# Patient Record
Sex: Female | Born: 1965
Health system: Southern US, Community
[De-identification: ages and names within clinical notes are randomized; demographics above are authoritative.]

## PROBLEM LIST (undated history)

## (undated) DIAGNOSIS — E785 Hyperlipidemia, unspecified: Secondary | ICD-10-CM

## (undated) DIAGNOSIS — E7800 Pure hypercholesterolemia, unspecified: Secondary | ICD-10-CM

## (undated) DIAGNOSIS — E669 Obesity, unspecified: Secondary | ICD-10-CM

## (undated) DIAGNOSIS — G4730 Sleep apnea, unspecified: Secondary | ICD-10-CM

## (undated) DIAGNOSIS — I10 Essential (primary) hypertension: Secondary | ICD-10-CM

## (undated) HISTORY — PX: STOMACH SURGERY: SHX791

## (undated) HISTORY — DX: Hyperlipidemia, unspecified: E78.5

## (undated) HISTORY — PX: RETAINED PLACENTA REMOVAL: SHX2336

---

## 2011-04-08 ENCOUNTER — Other Ambulatory Visit: Payer: Self-pay | Admitting: General Practice

## 2011-04-08 ENCOUNTER — Ambulatory Visit
Admission: RE | Admit: 2011-04-08 | Discharge: 2011-04-08 | Disposition: A | Discharge status: Home / Self Care | Payer: No Typology Code available for payment source | Source: Ambulatory Visit | Attending: General Practice | Admitting: General Practice

## 2011-04-08 DIAGNOSIS — R7611 Nonspecific reaction to tuberculin skin test without active tuberculosis: Secondary | ICD-10-CM

## 2011-05-25 ENCOUNTER — Emergency Department (HOSPITAL_COMMUNITY)
Admission: EM | Admit: 2011-05-25 | Discharge: 2011-05-26 | Disposition: A | Discharge status: Home / Self Care | Payer: Self-pay | Attending: Emergency Medicine | Admitting: Emergency Medicine

## 2011-05-25 ENCOUNTER — Other Ambulatory Visit: Payer: Self-pay

## 2011-05-25 ENCOUNTER — Emergency Department (HOSPITAL_COMMUNITY): Payer: Self-pay

## 2011-05-25 ENCOUNTER — Inpatient Hospital Stay (HOSPITAL_COMMUNITY): Payer: Self-pay

## 2011-05-25 ENCOUNTER — Encounter (HOSPITAL_COMMUNITY): Payer: Self-pay | Admitting: *Deleted

## 2011-05-25 ENCOUNTER — Inpatient Hospital Stay (HOSPITAL_COMMUNITY)
Admission: AD | Admit: 2011-05-25 | Discharge: 2011-05-25 | Disposition: A | Discharge status: Other Acute Inpatient Hospital | Payer: Self-pay | Source: Ambulatory Visit | Attending: Obstetrics & Gynecology | Admitting: Obstetrics & Gynecology

## 2011-05-25 DIAGNOSIS — Z79899 Other long term (current) drug therapy: Secondary | ICD-10-CM | POA: Insufficient documentation

## 2011-05-25 DIAGNOSIS — R109 Unspecified abdominal pain: Secondary | ICD-10-CM | POA: Insufficient documentation

## 2011-05-25 DIAGNOSIS — R112 Nausea with vomiting, unspecified: Secondary | ICD-10-CM | POA: Insufficient documentation

## 2011-05-25 DIAGNOSIS — R072 Precordial pain: Secondary | ICD-10-CM | POA: Insufficient documentation

## 2011-05-25 DIAGNOSIS — R0602 Shortness of breath: Secondary | ICD-10-CM | POA: Insufficient documentation

## 2011-05-25 DIAGNOSIS — R10819 Abdominal tenderness, unspecified site: Secondary | ICD-10-CM | POA: Insufficient documentation

## 2011-05-25 DIAGNOSIS — I10 Essential (primary) hypertension: Secondary | ICD-10-CM | POA: Insufficient documentation

## 2011-05-25 DIAGNOSIS — R509 Fever, unspecified: Secondary | ICD-10-CM | POA: Insufficient documentation

## 2011-05-25 DIAGNOSIS — R079 Chest pain, unspecified: Secondary | ICD-10-CM | POA: Insufficient documentation

## 2011-05-25 HISTORY — DX: Essential (primary) hypertension: I10

## 2011-05-25 LAB — CK TOTAL AND CKMB (NOT AT ARMC)
CK, MB: 1.8 ng/mL (ref 0.3–4.0)
Relative Index: 1.2 (ref 0.0–2.5)
Relative Index: 1.1 (ref 0.0–2.5)

## 2011-05-25 LAB — BLOOD GAS, ARTERIAL
Drawn by: 132
FIO2: 0.21 %
pCO2 arterial: 41.7 mmHg (ref 35.0–45.0)
pH, Arterial: 7.424 — ABNORMAL HIGH (ref 7.350–7.400)

## 2011-05-25 LAB — COMPREHENSIVE METABOLIC PANEL
ALT: 17 U/L (ref 0–35)
AST: 19 U/L (ref 0–37)
Albumin: 3.5 g/dL (ref 3.5–5.2)
Alkaline Phosphatase: 47 U/L (ref 39–117)
Glucose, Bld: 87 mg/dL (ref 70–99)
Potassium: 3.5 mEq/L (ref 3.5–5.1)
Sodium: 140 mEq/L (ref 135–145)
Total Protein: 7.6 g/dL (ref 6.0–8.3)

## 2011-05-25 LAB — TROPONIN I
Troponin I: <0.3 ng/mL (ref <0.30)
Troponin I: <0.3 ng/mL (ref <0.30)

## 2011-05-25 LAB — DIFFERENTIAL
Eosinophils Absolute: 0.1 10*3/uL (ref 0.0–0.7)
Eosinophils Relative: 2 % (ref 0–5)
Lymphs Abs: 2.4 10*3/uL (ref 0.7–4.0)
Monocytes Relative: 10 % (ref 3–12)
Neutrophils Relative %: 48 % (ref 43–77)

## 2011-05-25 LAB — CBC
MCH: 28.4 pg (ref 26.0–34.0)
MCV: 82.6 fL (ref 78.0–100.0)
Platelets: 222 10*3/uL (ref 150–400)
RBC: 4.26 MIL/uL (ref 3.87–5.11)

## 2011-05-25 LAB — URINALYSIS, ROUTINE W REFLEX MICROSCOPIC
Bilirubin Urine: NEGATIVE
Hgb urine dipstick: NEGATIVE
Specific Gravity, Urine: 1.012 (ref 1.005–1.030)
Urobilinogen, UA: 0.2 mg/dL (ref 0.0–1.0)

## 2011-05-25 MED ORDER — IOHEXOL 300 MG/ML  SOLN
100.0000 mL | Freq: Once | INTRAMUSCULAR | Status: AC | PRN
  Administered 2011-05-25: 100 mL via INTRAVENOUS

## 2011-05-25 MED ORDER — SODIUM CHLORIDE 0.9 % IV SOLN
Freq: Once | INTRAVENOUS | Status: AC
  Administered 2011-05-25: 14:00:00 via INTRAVENOUS

## 2011-05-25 NOTE — ED Notes (Signed)
Accompanied pt down to radiology dept.

## 2011-05-25 NOTE — ED Provider Notes (Addendum)
History   Chest pain, onset last pm. The patient states that she began feeling bad last night with chills and ? Fever. Today feeling short of breath and pain on left side of chest. Vomiting in exam room. 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 No chief complaint on file.  The history is provided by the patient. The history is limited by a language barrier.  The patient does speak [place] and understands. Her native language is [place]. I discussed with the patient that if she has any difficulty understanding the medical information that we can get a translator.  No past medical history on file.  No past surgical history on file.  No family history on file.  History  Substance Use Topics  . Smoking status: Not on file  . Smokeless tobacco: Not on file  . Alcohol Use: Not on file    OB History    No data available      Review of Systems  Constitutional: Positive for fever and chills. Negative for diaphoresis and fatigue.  HENT: Negative for ear pain, congestion, sore throat, facial swelling, neck pain, neck stiffness, dental problem and sinus pressure.   Eyes: Negative for photophobia, pain and discharge.  Respiratory: Positive for chest tightness and shortness of breath.   Cardiovascular: Positive for chest pain.  Gastrointestinal: Positive for nausea, vomiting and abdominal pain. Negative for diarrhea and constipation.  Genitourinary: Negative for dysuria, frequency, flank pain and difficulty urinating.  Musculoskeletal: Positive for back pain. Negative for myalgias and gait problem.  Skin: Negative for color change and rash.  Neurological: Positive for dizziness and light-headedness. Negative for facial asymmetry, speech difficulty, weakness, numbness and headaches.  Psychiatric/Behavioral: Negative for confusion and agitation.    Physical Exam  BP 134/65  Pulse 89  Temp(Src) 97.7 F (36.5 C) (Oral)  Resp 24  SpO2 98%  Physical Exam  Constitutional: No distress.       Obese female  Neck: Trachea normal. Neck supple. No JVD present.  Cardiovascular: Normal rate and regular rhythm.   Pulmonary/Chest: She exhibits tenderness.         Chest wall tenderness on palp left.  Abdominal: Soft. There is tenderness in the right lower quadrant. There is no rebound, no guarding and no CVA tenderness.    Psychiatric: Her behavior is normal.    ED Course  Procedures  MDM Patient with chest pain and shortness of breath.On arrival to MAU O2 sat was 82, pt. Placed on 2L O2 and in upright sitting position. Sat up to 95.  ABG drawn and Co2 =58, otherwise normal. EKG done, chest x-ray and will transfer to [facility] for further evaluation of chest pain and SOB. Spoke with Dr. Nino Parsley and he will accept. Patient.      [place], [place] 06/08/11 2006

## 2011-05-25 NOTE — Initial Assessments (Signed)
Pt transferred to [facility]  Via care link  Accompanied by pt aunt and care link team.

## 2011-05-25 NOTE — ED Notes (Signed)
Pt back from radiology. Tolerated well

## 2012-08-02 ENCOUNTER — Emergency Department (HOSPITAL_COMMUNITY): Payer: Self-pay

## 2012-08-02 ENCOUNTER — Emergency Department (HOSPITAL_COMMUNITY)
Admission: EM | Admit: 2012-08-02 | Discharge: 2012-08-02 | Disposition: A | Discharge status: Home / Self Care | Payer: Self-pay | Attending: Emergency Medicine | Admitting: Emergency Medicine

## 2012-08-02 ENCOUNTER — Encounter (HOSPITAL_COMMUNITY): Payer: Self-pay | Admitting: Emergency Medicine

## 2012-08-02 DIAGNOSIS — R0602 Shortness of breath: Secondary | ICD-10-CM | POA: Insufficient documentation

## 2012-08-02 DIAGNOSIS — R0609 Other forms of dyspnea: Secondary | ICD-10-CM | POA: Insufficient documentation

## 2012-08-02 DIAGNOSIS — R10819 Abdominal tenderness, unspecified site: Secondary | ICD-10-CM | POA: Insufficient documentation

## 2012-08-02 DIAGNOSIS — R0600 Dyspnea, unspecified: Secondary | ICD-10-CM

## 2012-08-02 DIAGNOSIS — I10 Essential (primary) hypertension: Secondary | ICD-10-CM | POA: Insufficient documentation

## 2012-08-02 DIAGNOSIS — R0989 Other specified symptoms and signs involving the circulatory and respiratory systems: Secondary | ICD-10-CM | POA: Insufficient documentation

## 2012-08-02 DIAGNOSIS — R42 Dizziness and giddiness: Secondary | ICD-10-CM

## 2012-08-02 DIAGNOSIS — R109 Unspecified abdominal pain: Secondary | ICD-10-CM

## 2012-08-02 DIAGNOSIS — K5730 Diverticulosis of large intestine without perforation or abscess without bleeding: Secondary | ICD-10-CM | POA: Insufficient documentation

## 2012-08-02 DIAGNOSIS — R51 Headache: Secondary | ICD-10-CM | POA: Insufficient documentation

## 2012-08-02 HISTORY — DX: Obesity, unspecified: E66.9

## 2012-08-02 LAB — COMPREHENSIVE METABOLIC PANEL
ALT: 13 U/L (ref 0–35)
AST: 18 U/L (ref 0–37)
Alkaline Phosphatase: 48 U/L (ref 39–117)
CO2: 25 mEq/L (ref 19–32)
GFR calc Af Amer: >90 mL/min (ref >90)
GFR calc non Af Amer: >90 mL/min (ref >90)
Glucose, Bld: 121 mg/dL — ABNORMAL HIGH (ref 70–99)
Potassium: 3.8 mEq/L (ref 3.5–5.1)
Sodium: 140 mEq/L (ref 135–145)
Total Protein: 7.6 g/dL (ref 6.0–8.3)

## 2012-08-02 LAB — CBC
Hemoglobin: 12.6 g/dL (ref 12.0–15.0)
Platelets: 239 10*3/uL (ref 150–400)
RBC: 4.61 MIL/uL (ref 3.87–5.11)
WBC: 5.7 10*3/uL (ref 4.0–10.5)

## 2012-08-02 LAB — POCT I-STAT, CHEM 8
HCT: 41 % (ref 36.0–46.0)
Hemoglobin: 13.9 g/dL (ref 12.0–15.0)
Potassium: 3.9 mEq/L (ref 3.5–5.1)
Sodium: 142 mEq/L (ref 135–145)

## 2012-08-02 LAB — URINALYSIS, ROUTINE W REFLEX MICROSCOPIC
Bilirubin Urine: NEGATIVE
Hgb urine dipstick: NEGATIVE
Ketones, ur: NEGATIVE mg/dL
Nitrite: NEGATIVE
pH: 6 (ref 5.0–8.0)

## 2012-08-02 LAB — POCT I-STAT TROPONIN I: Troponin i, poc: 0.01 ng/mL (ref 0.00–0.08)

## 2012-08-02 MED ORDER — IOHEXOL 300 MG/ML  SOLN: 20.0000 mL | INTRAMUSCULAR | Status: AC

## 2012-08-02 MED ORDER — IOHEXOL 300 MG/ML  SOLN
100.0000 mL | Freq: Once | INTRAMUSCULAR | Status: AC | PRN
  Administered 2012-08-02: 100 mL via INTRAVENOUS

## 2012-08-02 MED ORDER — HYDROMORPHONE HCL PF 1 MG/ML IJ SOLN
1.0000 mg | Freq: Once | INTRAMUSCULAR | Status: AC
  Administered 2012-08-02: 1 mg via INTRAVENOUS
  Filled 2012-08-02: qty 1

## 2012-08-02 NOTE — ED Provider Notes (Signed)
History     CSN: [id number]  Arrival date & time 08/02/12  0418   First MD Initiated Contact with Patient 08/02/12 0425      Chief Complaint  Patient presents with  . Shortness of Breath    (Consider location/radiation/quality/duration/timing/severity/associated sxs/prior treatment) HPI Comments: This is a 46 year old female past medical history hypertension hyperlipidemia and obesity he presents to the emergency department this morning with shortness of breath and dizziness. She states that she has had one episode of shortness of breath before this. It occurred earlier this week when she was coming home from church.   Additionally, she complains that her entire body hurts. She denies nausea, vomiting, constipation, diarrhea, dysuria.    The history is provided by the patient. No language interpreter was used.    Past Medical History  Diagnosis Date  . Hypertension   . Obesity     Past Surgical History  Procedure Date  . Retained placenta removal     after child birth    No family history on file.  History  Substance Use Topics  . Smoking status: Never Smoker   . Smokeless tobacco: Not on file  . Alcohol Use: Yes     once a month    OB History    Grav Para Term Preterm Abortions TAB SAB Ect Mult Living   5 2 2  0 3 3 0 0 0 2      Review of Systems  Constitutional: Negative for fever.  Gastrointestinal: Positive for abdominal pain.  Neurological: Positive for dizziness and headaches.  All other systems reviewed and are negative.    Allergies  Review of patient's allergies indicates no known allergies.  Home Medications   Current Outpatient Rx  Name Route Sig Dispense Refill  . ACETAMINOPHEN 500 MG PO TABS Oral Take 1,000 mg by mouth every 6 (six) hours as needed. For pain/fever     . DIAZEPAM 10 MG PO TABS Oral Take 10 mg by mouth 2 (two) times daily. For anxiety     . LISINOPRIL-HYDROCHLOROTHIAZIDE 20-12.5 MG PO TABS Oral Take 1 tablet by mouth daily.         BP 175/105  Pulse 76  Temp 97.2 F (36.2 C) (Oral)  Resp 18  SpO2 97%  Physical Exam  Nursing note and vitals reviewed. Constitutional: She is oriented to person, place, and time. She appears well-developed and well-nourished.  HENT:  Head: Normocephalic.  Eyes: Conjunctivae normal and EOM are normal. Pupils are equal, round, and reactive to light.  Neck: Normal range of motion. Neck supple.  Cardiovascular: Normal rate, regular rhythm and normal heart sounds.   Pulmonary/Chest: Effort normal and breath sounds normal.  Abdominal: Soft. Bowel sounds are normal. There is tenderness. There is rebound and guarding.  Musculoskeletal: Normal range of motion.  Neurological: She is alert and oriented to person, place, and time.  Skin: Skin is warm and dry.  Psychiatric: She has a normal mood and affect. Her behavior is normal. Judgment and thought content normal.    ED Course  Procedures (including critical care time)   Labs Reviewed  CBC  COMPREHENSIVE METABOLIC PANEL  URINALYSIS, ROUTINE W REFLEX MICROSCOPIC   ED ECG REPORT  I personally interpreted this EKG   Date: 08/02/2012   Rate:69   Rhythm: normal sinus rhythm  QRS Axis: normal  Intervals: normal  ST/T Wave abnormalities: normal  Conduction Disutrbances:none  Narrative Interpretation:   Old EKG Reviewed: unchanged  No results found.   No  diagnosis found.    MDM  46 year old female with multiple problems.  1-Vertigo: symptoms are reproducible with head movement.  Believe this to be a benign process.  2-Abdominal Pain: Awaiting CTA to rule out appy.    3-SOB: Seems to be 2/2 to the abdominal pain.  Vitals are stable.  This patient has been discussed at length with Dr. Hyacinth Meeker, who has helped form the treatment plan.  CTA pending.        Roxy Horseman, PA-C 08/02/12 0710

## 2012-08-02 NOTE — ED Provider Notes (Signed)
Medical screening examination/treatment/procedure(s) were conducted as a shared visit with non-physician practitioner(s) and myself.  I personally evaluated the patient during the encounter  Please see my separate respective documentation pertaining to this patient encounter   Vida Roller, MD 08/02/12 [phone]

## 2012-08-02 NOTE — ED Provider Notes (Signed)
Gail Jordan is a 46 y.o. female received as a checkout from Dr. Hyacinth Meeker to evaluate after CAT scan of abdomen to rule out surgical pathology. Initial workup was nondiagnostic. CT abdomen is negative for acute process. Brief review of her earlier workup: there is no apparent central neurologic, or cardiac source for her symptoms of dizziness and shortness of breath.  Medical decision making: Patient stable for discharge with outpatient management. She currently does not have a PCP. She can followup with her cardiologist, regarding the shortness of breath, and any acute medical problems that might be necessary.  Diagnoses #1 nonspecific dizziness. #2 shortness of breath. #3 nonspecific abdominal pain  Plan: Home Medications- usual plus Tylenol for pain; Home Treatments- regular diet; Recommended follow up- Cardiology 1 week; find a PCP  Flint Melter, MD 08/02/12 1029

## 2012-08-02 NOTE — ED Notes (Signed)
Pt reports sob since Sunday. Also Hurting all over her body. Pt reports dizziness. Denies nvd.

## 2012-08-02 NOTE — ED Notes (Signed)
I stat Troponin Results  cTnl  0.01 ng/mL

## 2012-08-02 NOTE — ED Provider Notes (Signed)
46 year old female who presents with several complaints  #1 shortness of breath, patient states that she awoke with shortness of breath after a nap when she got home from church yesterday, shortness of breath has been relatively persistent, not associated with coughing fevers chills nausea or vomiting.  #2 abdominal pain and "hurting all over". The patient states that she has pain in her abdomen but when palpated has pain mostly in the right lower quadrant with guarding. There is no obvious mass or change in the skin in this area. There is no other signs of abdominal tenderness and no peritoneal signs.  #3 dizziness: The patient states that she has a feeling of the room spinning. This seems to get worse when she changes position for example laying down or turning her head from side to side it improves as she holds it in that position. There is fatigability to her dizziness.  On physical exam the patient is obese, has tenderness to the right lower quadrant but clear heart and lung sounds with regular rate and rhythm strong peripheral artery pulses at the radial arteries. Her oropharynx is clear, mucous members are moist and vital signs show slight hypertension at 175/105.  Filed Vitals:   08/02/12 0427  BP: 175/105  Pulse: 76  Temp: 97.2 F (36.2 C)  Resp: 18    Lab work reveals normal liver function, normal renal function, normal electrolytes and normal blood counts. Her chest x-ray is fairly nonspecific showing some basilar atelectasis but no other acute findings. At this time due to the patient's right lower quadrant pain will perform a CT scan to rule out appendicitis. The patient states that she has had a placental surgery in the past and has a midline vertical scar in her lower abdomen.  The dizziness appears to be peripheral vertigo in origin, tympanic membranes are clear bilaterally and with the fatigability of the vertigo I suspect this is a benign source.  Hydromorphone given for pain,  reevaluate after CT scan.  She has no hypoxia, no respiratory distress and fairly normal lung sounds. Her EKG is nonischemic showing no signs of myocardial infarction. In fact a pulse of 69, normal intervals and normal ST segments. She has no risk factors for pulmonary embolism either.  Medical screening examination/treatment/procedure(s) were conducted as a shared visit with non-physician practitioner(s) and myself.  I personally evaluated the patient during the encounter      Vida Roller, MD 08/02/12 [phone]

## 2012-08-02 NOTE — ED Notes (Signed)
PT. REPORTS PROGRESSING SOB ONSET YESTERDAY , DENIES COUGH OR CONGESTION , LOW GRADE FEVER .

## 2012-08-02 NOTE — ED Notes (Signed)
Chem 8 results  Na  142 K  3.9 Cl  107 iCa  1.24 TC02  23 Glu  117 BUN  15 Crea  0.8 Hct  41% Hb  13.9 AnGap  17

## 2012-10-27 ENCOUNTER — Ambulatory Visit (INDEPENDENT_AMBULATORY_CARE_PROVIDER_SITE_OTHER): Payer: Self-pay | Admitting: Pulmonary Disease

## 2012-10-27 ENCOUNTER — Encounter: Payer: Self-pay | Admitting: Pulmonary Disease

## 2012-10-27 VITALS — BP 122/74 | HR 85 | Temp 97.6°F | Ht 66.0 in | Wt 286.2 lb

## 2012-10-27 DIAGNOSIS — G4733 Obstructive sleep apnea (adult) (pediatric): Secondary | ICD-10-CM

## 2012-10-27 NOTE — Progress Notes (Signed)
  Subjective:    Patient ID: Gail Jordan, female    DOB: 1966/07/13, 46 y.o.   MRN: [id number]  HPI The patient is a 46 year old female who comes in today as a self-referral for possible obstructive sleep apnea.  She has been noted to have loud snoring, as well as an abnormal breathing pattern during sleep.  She also admits to having episodes of shortness of breath at night, including a choking arousal.  She has frequent awakenings at night, and is not rested in the mornings upon arising.  She notes definite sleepiness during the day with inactivity, and will fall asleep easily with television or movies in the evening.  She denies issues with sleepiness while driving short distances, but does have sleep pressure with longer distances.  The patient has gained weight over the last year or 2, and her Epworth score today is 14.  Sleep Questionnaire: What time do you typically go to bed?( Between what hours) 11pm How long does it take you to fall asleep? 1 hour How many times during the night do you wake up? 6 What time do you get out of bed to start your day? 0700 Do you drive or operate heavy machinery in your occupation? No How much has your weight changed (up or down) over the past two years? (In pounds) 20 lb (9.072 kg) Have you ever had a sleep study before? No Do you currently use CPAP? No Do you wear oxygen at any time? No    Review of Systems  Constitutional: Negative for fever and unexpected weight change.  HENT: Positive for congestion, rhinorrhea, sneezing and sinus pressure. Negative for ear pain, nosebleeds, sore throat, trouble swallowing, dental problem and postnasal drip.   Eyes: Negative for redness and itching.  Respiratory: Positive for cough, chest tightness, shortness of breath and wheezing.   Cardiovascular: Positive for palpitations and leg swelling ( feet).  Gastrointestinal: Negative for nausea and vomiting.  Genitourinary: Negative for dysuria.  Musculoskeletal: Negative for  joint swelling.  Skin: Negative for rash.  Neurological: Positive for headaches.  Hematological: Does not bruise/bleed easily.  Psychiatric/Behavioral: Positive for dysphoric mood. The patient is not nervous/anxious.        Objective:   Physical Exam Constitutional:  Obese female, no acute distress.  Speaks some English to communicate.  HENT:  Nares patent without discharge, but large turbinates.  Oropharynx without exudate, palate and uvula are very thick and long, crowding posteriorly  Eyes:  Perrla, eomi, no scleral icterus  Neck:  No JVD, no TMG  Cardiovascular:  Normal rate, regular rhythm, no rubs or gallops.  No murmurs        Intact distal pulses  Pulmonary :  Normal breath sounds, no stridor or respiratory distress   No rales, rhonchi, or wheezing  Abdominal:  Soft, nondistended, bowel sounds present.  No tenderness noted.   Musculoskeletal:  mild lower extremity edema noted.  Lymph Nodes:  No cervical lymphadenopathy noted  Skin:  No cyanosis noted  Neurologic:  Appears sleepy, appropriate, moves all 4 extremities without obvious deficit.         Assessment & Plan:

## 2012-10-27 NOTE — Assessment & Plan Note (Signed)
The patient's history is very suggestive of clinically significant sleep apnea.  She is morbidly obese, has an abnormal upper airway anatomy, and has classic symptoms at night and during the day.  I have had a long discussion with her about the pathophysiology of sleep apnea, including its impact to her quality of life and cardiovascular health.  I think she needs to have a sleep study scheduled, and the patient is agreeable.

## 2012-10-27 NOTE — Patient Instructions (Addendum)
Will schedule for sleep study, and will arrange a followup visit with me when the results are available.  Work on weight loss.

## 2012-11-14 ENCOUNTER — Encounter (HOSPITAL_BASED_OUTPATIENT_CLINIC_OR_DEPARTMENT_OTHER): Payer: Self-pay

## 2012-11-15 ENCOUNTER — Ambulatory Visit (HOSPITAL_BASED_OUTPATIENT_CLINIC_OR_DEPARTMENT_OTHER): Payer: Self-pay | Attending: Pulmonary Disease | Admitting: Radiology

## 2012-11-15 VITALS — Ht 66.0 in | Wt 250.0 lb

## 2012-11-15 DIAGNOSIS — G4733 Obstructive sleep apnea (adult) (pediatric): Secondary | ICD-10-CM | POA: Insufficient documentation

## 2012-11-24 ENCOUNTER — Telehealth: Payer: Self-pay | Admitting: Pulmonary Disease

## 2012-11-24 NOTE — Telephone Encounter (Signed)
KC, have you read his PSG yet? Or do you want me to go ahead and set up an ov top discuss? Please advise thanks!

## 2012-11-28 ENCOUNTER — Encounter (HOSPITAL_BASED_OUTPATIENT_CLINIC_OR_DEPARTMENT_OTHER): Payer: Self-pay

## 2012-11-29 DIAGNOSIS — G4710 Hypersomnia, unspecified: Secondary | ICD-10-CM

## 2012-11-29 DIAGNOSIS — G4730 Sleep apnea, unspecified: Secondary | ICD-10-CM

## 2012-11-29 NOTE — Telephone Encounter (Signed)
Sleep study reviewed.Marland KitchenMarland KitchenPatient scheduled for OV 12/06/12 at 330 with KC (per [facility])

## 2012-11-29 NOTE — Procedures (Signed)
NAME:  Gail Jordan, Gail Jordan                ACCOUNT NO.:  [id number]  MEDICAL RECORD NO.:  [id number]          PATIENT TYPE:  OUT  LOCATION:  SLEEP CENTER                 FACILITY:  [facility]  PHYSICIAN:  Barbaraann Share, MD,FCCPDATE OF BIRTH:  05/11/1965  DATE OF STUDY:  11/15/2012                           NOCTURNAL POLYSOMNOGRAM  REFERRING PHYSICIAN:  Barbaraann Share, MD,FCCP  INDICATION FOR STUDY:  Hypersomnia with sleep apnea.  EPWORTH SLEEPINESS SCORE:  18.  SLEEP ARCHITECTURE:  The patient had a total sleep time of 286 minutes with no slow-wave sleep and only 41 minutes of REM.  Sleep onset latency was normal at 16 minutes and REM onset was normal at 104 minutes.  Sleep efficiency was moderately reduced at 81%.  RESPIRATORY DATA:  The patient was found to have 51 apneas and 241 obstructive hypopneas, giving her an apnea-hypopnea index of 61 events per hour.  The events occurred in all body positions, and there was loud snoring noted throughout.  OXYGEN DATA:  O2 desaturation as low as 69% was noted with the patient's obstructive events.  CARDIAC DATA:  No clinically significant arrhythmias were seen.  MOVEMENTS/PARASOMNIA:  The patient had no significant leg jerks or other abnormal behaviors noted.  IMPRESSION/RECOMMENDATION:  Severe obstructive sleep apnea/hypopnea syndrome with an AHI of 61 events per hour and oxygen desaturation as low as 69%.  Treatment for this degree of sleep apnea should focus primarily on CPAP as well as weight loss.     Barbaraann Share, MD,FCCP Diplomate, American Board of Sleep Medicine    [facility]/MEDQ  D:  11/29/2012 08:14:28  T:  11/29/2012 08:47:52  Job:  [id number]

## 2012-12-06 ENCOUNTER — Ambulatory Visit (INDEPENDENT_AMBULATORY_CARE_PROVIDER_SITE_OTHER): Payer: Self-pay | Admitting: Pulmonary Disease

## 2012-12-06 ENCOUNTER — Encounter: Payer: Self-pay | Admitting: Pulmonary Disease

## 2012-12-06 VITALS — BP 118/80 | HR 87 | Temp 98.0°F | Ht 66.0 in | Wt 291.2 lb

## 2012-12-06 DIAGNOSIS — G4733 Obstructive sleep apnea (adult) (pediatric): Secondary | ICD-10-CM

## 2012-12-06 NOTE — Patient Instructions (Addendum)
Will set you up for cpap at a moderate pressure level.  Please let us know if any tolerance issues. Work on weight loss followup with me in 6 weeks.

## 2012-12-06 NOTE — Assessment & Plan Note (Signed)
The patient has severe obstructive sleep apnea by her recent sleep study, and will need a trial of CPAP while she is working on weight loss.  The patient is willing to give this a try, and she is to call if she has any issues with tolerance. I will set the patient up on cpap at a moderate pressure level to allow for desensitization, and will troubleshoot the device over the next 4-6weeks if needed.  The pt is to call me if having issues with tolerance.  Will then optimize the pressure once patient is able to wear cpap on a consistent basis.

## 2012-12-06 NOTE — Progress Notes (Signed)
  Subjective:    Patient ID: Gail Jordan, female    DOB: 30-Oct-1966, 47 y.o.   MRN: [id number]  HPI The patient comes in today for all of her recent sleep study.  She was found to have severe obstructive sleep apnea, with an AHI of 61 events per hour.  I have reviewed the study with her in detail, and answered all of her questions.   Review of Systems  Constitutional: Positive for unexpected weight change ( 39lb gain since 10/27/12). Negative for fever.  HENT: Negative for ear pain, nosebleeds, congestion, sore throat, rhinorrhea, sneezing, trouble swallowing, dental problem, postnasal drip and sinus pressure.   Eyes: Negative for redness and itching.  Respiratory: Positive for cough and chest tightness. Negative for shortness of breath and wheezing.   Cardiovascular: Negative for palpitations and leg swelling.  Gastrointestinal: Negative for nausea and vomiting.  Genitourinary: Negative for dysuria.  Musculoskeletal: Negative for joint swelling.  Skin: Negative for rash.  Neurological: Negative for headaches.  Hematological: Does not bruise/bleed easily.  Psychiatric/Behavioral: Positive for dysphoric mood. The patient is not nervous/anxious.        Objective:   Physical Exam Morbidly obese female in no acute distress Nose without purulence or discharge noted Neck without lymphadenopathy or thyromegaly Lower extremities with significant edema, no cyanosis Awake, but appears to be sleepy, moves all 4 extremities.       Assessment & Plan:

## 2012-12-11 ENCOUNTER — Encounter (HOSPITAL_COMMUNITY): Payer: Self-pay

## 2012-12-11 ENCOUNTER — Emergency Department (INDEPENDENT_AMBULATORY_CARE_PROVIDER_SITE_OTHER)
Admission: EM | Admit: 2012-12-11 | Discharge: 2012-12-11 | Disposition: A | Discharge status: Home / Self Care | Payer: Self-pay | Source: Home / Self Care | Attending: Family Medicine | Admitting: Family Medicine

## 2012-12-11 DIAGNOSIS — G4733 Obstructive sleep apnea (adult) (pediatric): Secondary | ICD-10-CM

## 2012-12-11 NOTE — ED Notes (Signed)
Patient states has trouble sleeping at night. Had a sleep study done in hospital, needs a cpap machine

## 2012-12-11 NOTE — ED Provider Notes (Signed)
History     CSN: [id number]  Arrival date & time 12/11/12  1553   First MD Initiated Contact with Patient 12/11/12 1717      Chief Complaint  Patient presents with  . Sleep Apnea   (Consider location/radiation/quality/duration/timing/severity/associated sxs/prior treatment) HPI This patient recently had a sleep study that revealed she had severe obstructive sleep apnea.  She was seen by pulmonologist and they recommended CPAP.  Unfortunately because the patient does not have medical insurance she has not been able to get a CPAP device to help her.  She reports that she is only able to sleep 3-4 hours at one time.  She reports that she desperately needs assistance with getting a CPAP device.  She is requesting assistance.  She does not have an orange card.  She has no financial assistance and she is living with her children.  Past Medical History  Diagnosis Date  . Hypertension   . Obesity   . Hyperlipidemia     Past Surgical History  Procedure Date  . Retained placenta removal     after child birth  . Stomach surgery     possibly???    Family History  Problem Relation Age of Onset  . Hypertension Father   . Hypertension Sister     History  Substance Use Topics  . Smoking status: Former Smoker -- 2 years    Types: Cigarettes    Quit date: 11/08/2010  . Smokeless tobacco: Not on file     Comment: 1 pack per 1-2 weeks.   . Alcohol Use: Yes     Comment: once a month--wine    OB History    Grav Para Term Preterm Abortions TAB SAB Ect Mult Living   5 2 2  0 3 3 0 0 0 2     Review of Systems  Constitutional: Positive for fatigue.       Chronic insomnia and loud snoring  HENT: Positive for congestion and rhinorrhea.   Musculoskeletal: Positive for arthralgias.  Psychiatric/Behavioral: Positive for dysphoric mood.  All other systems reviewed and are negative.    Allergies  Review of patient's allergies indicates no known allergies.  Home Medications   Current  Outpatient Rx  Name  Route  Sig  Dispense  Refill  . ACETAMINOPHEN 500 MG PO TABS   Oral   Take 1,000 mg by mouth every 6 (six) hours as needed. For pain/fever          . ATORVASTATIN CALCIUM 20 MG PO TABS   Oral   Take 20 mg by mouth daily.         . IBUPROFEN 100 MG PO TABS   Oral   Take 200 mg by mouth every 6 (six) hours as needed.         Marland Kitchen LISINOPRIL-HYDROCHLOROTHIAZIDE 20-12.5 MG PO TABS   Oral   Take 1 tablet by mouth daily.           Marland Kitchen OMEPRAZOLE 40 MG PO CPDR   Oral   Take 40 mg by mouth daily.           BP 147/98  Pulse 85  Temp 97.9 F (36.6 C) (Oral)  Resp 16  SpO2 98%  Physical Exam  Nursing note and vitals reviewed. Constitutional: She is oriented to person, place, and time. She appears well-developed and well-nourished. No distress.       Morbidly obese female  HENT:  Head: Normocephalic and atraumatic.  Eyes: Conjunctivae normal and EOM are  normal. Pupils are equal, round, and reactive to light.  Neck: Normal range of motion. Neck supple.  Cardiovascular: Normal rate, regular rhythm and normal heart sounds.   Pulmonary/Chest:       Coarse upper anterior airway noises  Abdominal: Soft. Bowel sounds are normal.  Musculoskeletal: Normal range of motion.  Neurological: She is alert and oriented to person, place, and time.  Skin: Skin is warm and dry.  Psychiatric: Judgment and thought content normal.       Patient crying for most of the encounter    ED Course  Procedures (including critical care time)  Labs Reviewed - No data to display No results found.  No diagnosis found.   MDM  IMPRESSION  Severe obstructive sleep apnea  RECOMMENDATIONS / PLAN The patient is in desperate need for a CPAP device.  She has no medical insurance and a dilemma it in terms of being able to afford treatment.  We made some calls to [facility] and they're willing to provide the patient with financial assistance if she qualifies for their  assistance program.  If she does qualifies she will be able to obtain a CPAP device.  She is at high-risk for complications related to uncontrolled obstructive sleep apnea.  The patient completed the financial assistance papers in the office and we were able to fax them to [facility].  I am hoping that they will be able to provide her with some type of assistance for a medical device.  We may need to try other accompanies to request assistance.   FOLLOW UP 1 month  The patient was given clear instructions to go to ER or return to medical center if symptoms don't improve, worsen or new problems develop.  The patient verbalized understanding.  The patient was told to call to get lab results if they haven't heard anything in the next week.            Cleora Fleet, MD 12/11/12 [phone]

## 2013-01-04 DIAGNOSIS — E785 Hyperlipidemia, unspecified: Secondary | ICD-10-CM | POA: Insufficient documentation

## 2013-01-04 DIAGNOSIS — I10 Essential (primary) hypertension: Secondary | ICD-10-CM | POA: Insufficient documentation

## 2013-01-04 DIAGNOSIS — K219 Gastro-esophageal reflux disease without esophagitis: Secondary | ICD-10-CM | POA: Insufficient documentation

## 2013-05-15 ENCOUNTER — Other Ambulatory Visit: Payer: Self-pay | Admitting: Cardiology

## 2013-05-15 ENCOUNTER — Ambulatory Visit
Admission: RE | Admit: 2013-05-15 | Discharge: 2013-05-15 | Disposition: A | Discharge status: Home / Self Care | Payer: No Typology Code available for payment source | Source: Ambulatory Visit | Attending: Cardiology | Admitting: Cardiology

## 2013-05-15 DIAGNOSIS — A150 Tuberculosis of lung: Secondary | ICD-10-CM

## 2014-01-29 ENCOUNTER — Encounter: Payer: Self-pay | Admitting: Pulmonary Disease

## 2014-01-29 ENCOUNTER — Ambulatory Visit (INDEPENDENT_AMBULATORY_CARE_PROVIDER_SITE_OTHER): Payer: Self-pay | Admitting: Pulmonary Disease

## 2014-01-29 VITALS — BP 130/84 | HR 93 | Temp 98.3°F | Ht 66.0 in | Wt 288.0 lb

## 2014-01-29 DIAGNOSIS — G4733 Obstructive sleep apnea (adult) (pediatric): Secondary | ICD-10-CM

## 2014-01-29 NOTE — Patient Instructions (Signed)
Will have advanced get you a machine where the pressure can adjust itself.  Hopefully , you can tolerate this better.  If you still cannot wear, it may be that cpap is not a good treatment for you.  Work on weight loss followup with me again in 6mos, but I will call you once I get a download off your machine.

## 2014-01-29 NOTE — Assessment & Plan Note (Signed)
The patient is having difficulty wearing CPAP, but because of the language. Her, it is really unclear why. It sounds like she is having issues with breathing against the pressure, and would like to try her on an auto device for a period of time to see if this is better tolerated. I stressed to her the importance of treating her sleep apnea aggressively, and have encouraged her to work aggressively on weight loss

## 2014-01-29 NOTE — Progress Notes (Signed)
   Subjective:    Patient ID: Gail Jordan, female    DOB: 21-Nov-1965, 48 y.o.   MRN: [id number]  HPI Patient comes in today for followup of her severe obstructive sleep apnea. It is very difficult to communicate with the patient because of a language barrier. She was started on CPAP at the last visit, but it is unclear when she actually got her CPAP machine. She is currently having issues wearing the device, but it is very difficult to figure out why that is. As best I can tell, it is a pressure issue.   Review of Systems  Constitutional: Negative for fever and unexpected weight change.  HENT: Negative for congestion, dental problem, ear pain, nosebleeds, postnasal drip, rhinorrhea, sinus pressure, sneezing, sore throat and trouble swallowing.   Eyes: Negative for redness and itching.  Respiratory: Negative for cough, chest tightness, shortness of breath and wheezing.   Cardiovascular: Negative for palpitations and leg swelling.  Gastrointestinal: Negative for nausea and vomiting.  Genitourinary: Negative for dysuria.  Musculoskeletal: Negative for joint swelling.  Skin: Negative for rash.  Neurological: Negative for headaches.  Hematological: Does not bruise/bleed easily.  Psychiatric/Behavioral: Negative for dysphoric mood. The patient is not nervous/anxious.        Objective:   Physical Exam Morbidly obese female in no acute distress Nose without purulence or discharge noted No skin breakdown or pressure necrosis from the CPAP Lower extremities with edema noted, no cyanosis Awake, but appears sleepy, moves all 4 extremities.        Assessment & Plan:

## 2014-08-01 ENCOUNTER — Ambulatory Visit: Payer: Self-pay | Admitting: Pulmonary Disease

## 2014-08-06 ENCOUNTER — Other Ambulatory Visit: Payer: Self-pay | Admitting: Pulmonary Disease

## 2014-08-06 ENCOUNTER — Ambulatory Visit (INDEPENDENT_AMBULATORY_CARE_PROVIDER_SITE_OTHER): Payer: Self-pay | Admitting: Pulmonary Disease

## 2014-08-06 ENCOUNTER — Encounter: Payer: Self-pay | Admitting: Pulmonary Disease

## 2014-08-06 VITALS — BP 124/76 | HR 91 | Temp 99.1°F | Ht 66.0 in | Wt 295.0 lb

## 2014-08-06 DIAGNOSIS — G4733 Obstructive sleep apnea (adult) (pediatric): Secondary | ICD-10-CM

## 2014-08-06 NOTE — Progress Notes (Signed)
   Subjective:    Patient ID: Gail Jordan, female    DOB: 1966/10/06, 48 y.o.   MRN: [id number]  HPI  the patient comes in today for followup of her known severe sleep apnea. It is very difficult to tell how compliant she has been with her device because of the language. Her, but she tells me that she has issues with tolerance. She is having issues with her mask, and tells me that it is uncomfortable. She understands that she needs to work aggressively on weight loss.   Review of Systems  Constitutional: Negative for fever and unexpected weight change.  HENT: Negative for congestion, dental problem, ear pain, nosebleeds, postnasal drip, rhinorrhea, sinus pressure, sneezing, sore throat and trouble swallowing.   Eyes: Negative for redness and itching.  Respiratory: Negative for cough, chest tightness, shortness of breath and wheezing.   Cardiovascular: Negative for palpitations and leg swelling.  Gastrointestinal: Negative for nausea and vomiting.  Genitourinary: Negative for dysuria.  Musculoskeletal: Negative for joint swelling.  Skin: Negative for rash.  Neurological: Negative for headaches.  Hematological: Does not bruise/bleed easily.  Psychiatric/Behavioral: Negative for dysphoric mood. The patient is not nervous/anxious.        Objective:   Physical Exam Morbidly obese female in no acute distress Nose without purulence or discharge noted No skin breakdown or pressure necrosis from the CPAP mask Neck without lymphadenopathy or thyromegaly Lower extremities with mild edema, no cyanosis Awake, but does appear mildly sleepy, moves all 4 extremities.       Assessment & Plan:

## 2014-08-06 NOTE — Patient Instructions (Signed)
Will see if your homecare company can work with you on mask fit. Keep working on weight loss Will see you back in 6mos to check on things.

## 2014-08-06 NOTE — Assessment & Plan Note (Signed)
The patient has not been wearing her CPAP nightly basis, but it is very difficult to understand her ongoing compliance issues. From her description, I suspect that it is related to her mask fit, and will have her home care company work with her on this.  I have stressed to her again the importance of staying on the CPAP nightly, and to work aggressively on weight loss. Ultimately, this may not be an adequate treatment for her, and may have to consider other options.

## 2014-09-03 ENCOUNTER — Ambulatory Visit (INDEPENDENT_AMBULATORY_CARE_PROVIDER_SITE_OTHER)
Admission: RE | Admit: 2014-09-03 | Discharge: 2014-09-03 | Disposition: A | Discharge status: Home / Self Care | Payer: Self-pay | Source: Ambulatory Visit | Attending: Pulmonary Disease | Admitting: Pulmonary Disease

## 2014-09-03 ENCOUNTER — Ambulatory Visit (INDEPENDENT_AMBULATORY_CARE_PROVIDER_SITE_OTHER): Payer: Self-pay | Admitting: Pulmonary Disease

## 2014-09-03 ENCOUNTER — Encounter: Payer: Self-pay | Admitting: Pulmonary Disease

## 2014-09-03 VITALS — BP 128/88 | HR 77 | Temp 97.4°F | Ht 66.0 in | Wt 299.2 lb

## 2014-09-03 DIAGNOSIS — R0609 Other forms of dyspnea: Secondary | ICD-10-CM | POA: Insufficient documentation

## 2014-09-03 NOTE — Progress Notes (Signed)
   Subjective:    Patient ID: Gail Jordan, female    DOB: 01-06-1966, 48 y.o.   MRN: [id number]  HPI The patient is a 48 year old female who had been asked to see for dyspnea on exertion. The history is somewhat difficult because of the language barrier, but adequate information could be obtained. She gives a two-year history of dyspnea on exertion that has been progressive in nature. She describes a 1-2 block shortness of breath on flat ground at a moderate pace, and will also get winded walking up one flight of stairs. She has minimal smoking history, and has not done so since 2005. She has no history of asthma or other pulmonary disease, but is being treated for obstructive sleep apnea. She denies any significant cough or lower extremity edema. She has had no chest pain, and denies prior cardiac evaluation. She does state that her weight is up over 15 pounds in the last 1 year. She apparently has not had a recent chest x-ray, nor history of pulmonary function studies.   Review of Systems  Constitutional: Negative for fever and unexpected weight change.  HENT: Negative for congestion, dental problem, ear pain, nosebleeds, postnasal drip, rhinorrhea, sinus pressure, sneezing, sore throat and trouble swallowing.   Eyes: Negative for redness and itching.  Respiratory: Positive for shortness of breath. Negative for cough, chest tightness and wheezing.   Cardiovascular: Negative for palpitations and leg swelling.  Gastrointestinal: Negative for nausea and vomiting.  Genitourinary: Negative for dysuria.  Musculoskeletal: Negative for joint swelling.  Skin: Negative for rash.  Neurological: Positive for headaches.  Hematological: Does not bruise/bleed easily.  Psychiatric/Behavioral: Negative for dysphoric mood. The patient is not nervous/anxious.        Objective:   Physical Exam Constitutional:  Morbidly obese female, no acute distress  HENT:  Nares patent without discharge  Oropharynx  without exudate, palate and uvula are thickened and elongated  Eyes:  Perrla, eomi, no scleral icterus  Neck:  No JVD, no TMG  Cardiovascular:  Normal rate, regular rhythm, no rubs or gallops.  No murmurs        Intact distal pulses  Pulmonary :  Normal breath sounds but decreased depth of inspiration, no stridor or respiratory distress   No rales, rhonchi, or wheezing  Abdominal:  Soft, nondistended, bowel sounds present.  No tenderness noted.   Musculoskeletal:  No lower extremity edema noted.  Lymph Nodes:  No cervical lymphadenopathy noted  Skin:  No cyanosis noted  Neurologic:  Alert, appropriate, moves all 4 extremities without obvious deficit.         Assessment & Plan:

## 2014-09-03 NOTE — Patient Instructions (Signed)
Will schedule for breathing studies, and see you back the same day for review. Work on weight loss.

## 2014-09-03 NOTE — Assessment & Plan Note (Signed)
The patient describes dyspnea on exertion that has been an issue for her over the last 2 years, and is progressive in nature. There is nothing to suggest significant pulmonary disease on history or exam today, and her lungs were totally clear to auscultation. I suspect that her dyspnea on exertion is secondary to her morbid obesity and deconditioning, but will check a chest x-ray and pulmonary function studies for completeness. She has no history to suggest thromboembolic disease, but she is certainly at risk for chronic thromboembolic disease given her obesity and frequent [place] travel to [place]. She may need a ventilation perfusion scan for completeness if her pulmonary function studies and x-ray are normal. Finally, if nothing is found from a pulmonary standpoint, she may need a cardiac workup through her primary care physician.

## 2014-09-09 ENCOUNTER — Encounter: Payer: Self-pay | Admitting: Pulmonary Disease

## 2014-09-20 ENCOUNTER — Ambulatory Visit (HOSPITAL_COMMUNITY)
Admission: RE | Admit: 2014-09-20 | Discharge: 2014-09-20 | Disposition: A | Discharge status: Home / Self Care | Payer: Self-pay | Source: Ambulatory Visit | Attending: Pulmonary Disease | Admitting: Pulmonary Disease

## 2014-09-20 ENCOUNTER — Ambulatory Visit (INDEPENDENT_AMBULATORY_CARE_PROVIDER_SITE_OTHER): Payer: Self-pay | Admitting: Pulmonary Disease

## 2014-09-20 ENCOUNTER — Encounter: Payer: Self-pay | Admitting: Pulmonary Disease

## 2014-09-20 VITALS — BP 124/68 | HR 70 | Temp 97.6°F | Ht 62.0 in | Wt 298.6 lb

## 2014-09-20 DIAGNOSIS — R0609 Other forms of dyspnea: Secondary | ICD-10-CM | POA: Insufficient documentation

## 2014-09-20 LAB — PULMONARY FUNCTION TEST
DL/VA % PRED: 143 %
DL/VA: 6.52 ml/min/mmHg/L
DLCO COR: 13.7 ml/min/mmHg
DLCO UNC % PRED: 63 %
DLCO cor % pred: 63 %
DLCO unc: 13.7 ml/min/mmHg
FEF 25-75 POST: 2.89 L/s
FEF 25-75 PRE: 1.22 L/s
FEF2575-%CHANGE-POST: 137 %
FEF2575-%PRED-POST: 124 %
FEF2575-%Pred-Pre: 52 %
FEV1-%Change-Post: 20 %
FEV1-%Pred-Post: 54 %
FEV1-%Pred-Pre: 45 %
FEV1-POST: 1.19 L
FEV1-PRE: 0.99 L
FEV1FVC-%CHANGE-POST: 2 %
FEV1FVC-%PRED-PRE: 117 %
FEV6-%CHANGE-POST: 17 %
FEV6-%PRED-PRE: 39 %
FEV6-%Pred-Post: 45 %
FEV6-Post: 1.2 L
FEV6-Pre: 1.02 L
FEV6FVC-%PRED-POST: 103 %
FEV6FVC-%Pred-Pre: 103 %
FVC-%CHANGE-POST: 17 %
FVC-%PRED-POST: 44 %
FVC-%Pred-Pre: 38 %
FVC-PRE: 1.02 L
FVC-Post: 1.2 L
PRE FEV1/FVC RATIO: 97 %
Post FEV1/FVC ratio: 99 %
Post FEV6/FVC ratio: 100 %
Pre FEV6/FVC Ratio: 100 %
RV % PRED: 21 %
RV: 0.36 L
TLC % pred: 59 %
TLC: 2.85 L

## 2014-09-20 MED ORDER — ALBUTEROL SULFATE (2.5 MG/3ML) 0.083% IN NEBU: 2.5000 mg | INHALATION_SOLUTION | Freq: Once | RESPIRATORY_TRACT | Status: DC

## 2014-09-20 NOTE — Progress Notes (Signed)
   Subjective:    Patient ID: Gail Jordan, female    DOB: 05-25-1966, 48 y.o.   MRN: [id number]  HPI The patient comes in today for follow-up of her pulmonary function studies, as part of a workup for dyspnea on exertion. Unfortunately, the patient had a very difficult time performing the test, and they are basically uninterpretable. I think it is unlikely that she has airflow obstruction, and very likely that her breathing issue is related to her morbid obesity and deconditioning. Of note, her chest x-ray from the last visit did show cardiomegaly with pulmonary venous congestion.   Review of Systems  Constitutional: Negative for fever and unexpected weight change.  HENT: Negative for congestion, dental problem, ear pain, nosebleeds, postnasal drip, rhinorrhea, sinus pressure, sneezing, sore throat and trouble swallowing.   Eyes: Negative for redness and itching.  Respiratory: Positive for chest tightness, shortness of breath and wheezing. Negative for cough.   Cardiovascular: Negative for palpitations and leg swelling.  Gastrointestinal: Negative for nausea and vomiting.  Genitourinary: Negative for dysuria.  Musculoskeletal: Negative for joint swelling.  Skin: Negative for rash.  Neurological: Negative for headaches.  Hematological: Does not bruise/bleed easily.  Psychiatric/Behavioral: Negative for dysphoric mood. The patient is not nervous/anxious.        Objective:   Physical Exam Morbidly obese female in no acute distress Nose without purulence or discharge noted Neck without lymphadenopathy or thyromegaly Lower extremities with edema noted, no cyanosis Alert and oriented, moves all 4 extremities.      Assessment & Plan:

## 2014-09-20 NOTE — Patient Instructions (Signed)
I suspect that your breathing issue is primarily related to your weight and conditioning. Would work very hard on these.  Your breathing studies were inconsistent, and unclear if there is a lung problem.  Will try you on an inhaler to see if you see a difference Try symbicort 80 2 inhalations am and pm everyday for 2 weeks.  If you do not see a big difference in your breathing, discontinue.  If you see a big improvement, let us know. Will send a note to your primary doctor to consider an evaluation of your heart function.  Will see you back next year for followup of your sleep apnea as scheduled for next year.

## 2014-09-20 NOTE — Assessment & Plan Note (Signed)
The patient has had pulmonary function studies to workup her complaint of dyspnea on exertion. Unfortunately, she had a very poor understanding of the directions, had no consistent effort throughout the testing, and was basically unable to do the DLCO maneuver. As best I can tell, I suspect she does not have airflow obstruction, and that her change postbronchodilator is more related to a better effort. I will give her the benefit of the doubt and try her on an inhaler, but I suspect that her morbid obesity and thoracic restriction is more of an issue than anything else. She also has cardiomegaly with the suggestion of pulmonary venous congestion on her chest x-ray, and this raises the issue of diastolic heart failure. I will send a note to her primary care physician to consider a cardiac workup. In the meantime, I have also encouraged the patient to work aggressively on weight loss and conditioning.

## 2015-02-04 ENCOUNTER — Ambulatory Visit (INDEPENDENT_AMBULATORY_CARE_PROVIDER_SITE_OTHER): Payer: Self-pay | Admitting: Pulmonary Disease

## 2015-02-04 ENCOUNTER — Encounter: Payer: Self-pay | Admitting: Pulmonary Disease

## 2015-02-04 VITALS — BP 134/76 | HR 71 | Temp 97.6°F | Ht 66.0 in | Wt 291.6 lb

## 2015-02-04 DIAGNOSIS — G4733 Obstructive sleep apnea (adult) (pediatric): Secondary | ICD-10-CM

## 2015-02-04 NOTE — Assessment & Plan Note (Signed)
The patient tells me that she is wearing C Pap mask it least 5 days a week, and that it definitely helps her sleep and daytime alertness. She is overdue for a new mask seal and supplies, and I will send an order to her home care company to get this for her. I have encouraged her to continue working on weight loss, and commended her on her recent decrease since the last visit.

## 2015-02-04 NOTE — Patient Instructions (Signed)
Continue on cpap, and keep up with mask changes and supplies Keep working on weight loss. You are doing well followup with me again in one year if doing well.

## 2015-02-04 NOTE — Progress Notes (Signed)
   Subjective:    Patient ID: Gail Jordan, female    DOB: 1966-06-26, 49 y.o.   MRN: [id number]  HPI The patient comes in today for follow-up of her obstructive sleep apnea. She is wearing C Pap compliantly since the last visit, and feels that it continues to help her sleep. She is overdue for her mask cushion and supplies, and I will send an order for this. I have asked her to work aggressively on weight loss, and her weight is down 7 pounds from the last visit. She had also been complaining of dyspnea on exertion, but was unable to do any of the PFT maneuvers for evaluation. I have given her a trial of Symbicort to try, but she discontinued because of no change in her breathing.   Review of Systems  Constitutional: Negative for fever and unexpected weight change.  HENT: Negative for congestion, dental problem, ear pain, nosebleeds, postnasal drip, rhinorrhea, sinus pressure, sneezing, sore throat and trouble swallowing.   Eyes: Negative for redness and itching.  Respiratory: Positive for shortness of breath. Negative for cough, chest tightness and wheezing.   Cardiovascular: Negative for palpitations and leg swelling.  Gastrointestinal: Negative for nausea and vomiting.  Genitourinary: Negative for dysuria.  Musculoskeletal: Negative for joint swelling.  Skin: Negative for rash.  Neurological: Negative for headaches.  Hematological: Does not bruise/bleed easily.  Psychiatric/Behavioral: Negative for dysphoric mood. The patient is not nervous/anxious.        Objective:   Physical Exam Morbidly obese female in no acute distress Nose without purulence or discharge noted Neck without lymphadenopathy or thyromegaly No skin breakdown or pressure necrosis from the C Pap mask Lower extremities with edema noted, no cyanosis Alert and oriented, moves all 4 extremities.       Assessment & Plan:

## 2015-03-22 ENCOUNTER — Emergency Department (HOSPITAL_COMMUNITY)
Admission: EM | Admit: 2015-03-22 | Discharge: 2015-03-22 | Disposition: A | Discharge status: Home / Self Care | Payer: Self-pay | Attending: Emergency Medicine | Admitting: Emergency Medicine

## 2015-03-22 ENCOUNTER — Emergency Department (HOSPITAL_COMMUNITY): Payer: Self-pay

## 2015-03-22 ENCOUNTER — Encounter (HOSPITAL_COMMUNITY): Payer: Self-pay | Admitting: Adult Health

## 2015-03-22 DIAGNOSIS — Z87891 Personal history of nicotine dependence: Secondary | ICD-10-CM | POA: Insufficient documentation

## 2015-03-22 DIAGNOSIS — E669 Obesity, unspecified: Secondary | ICD-10-CM | POA: Insufficient documentation

## 2015-03-22 DIAGNOSIS — M79672 Pain in left foot: Secondary | ICD-10-CM | POA: Insufficient documentation

## 2015-03-22 DIAGNOSIS — E785 Hyperlipidemia, unspecified: Secondary | ICD-10-CM | POA: Insufficient documentation

## 2015-03-22 DIAGNOSIS — I10 Essential (primary) hypertension: Secondary | ICD-10-CM | POA: Insufficient documentation

## 2015-03-22 DIAGNOSIS — Z79899 Other long term (current) drug therapy: Secondary | ICD-10-CM | POA: Insufficient documentation

## 2015-03-22 MED ORDER — PREDNISONE 20 MG PO TABS: 40.0000 mg | ORAL_TABLET | Freq: Every day | ORAL | Status: DC

## 2015-03-22 MED ORDER — HYDROCODONE-ACETAMINOPHEN 5-325 MG PO TABS: 1.0000 | ORAL_TABLET | Freq: Four times a day (QID) | ORAL | Status: DC | PRN

## 2015-03-22 MED ORDER — KETOROLAC TROMETHAMINE 60 MG/2ML IM SOLN
60.0000 mg | Freq: Once | INTRAMUSCULAR | Status: AC
  Administered 2015-03-22: 60 mg via INTRAMUSCULAR
  Filled 2015-03-22: qty 2

## 2015-03-22 NOTE — ED Notes (Signed)
Presents with left ankle swelling and pain -began yesterday-no hx of gout or arthritis-unsure if she injured self-does not remember injurying self. ankle swollen and warm.  Toes warm to touch-no redness, pain does not radiate.

## 2015-03-22 NOTE — ED Notes (Signed)
Patient transported to X-ray 

## 2015-03-22 NOTE — Discharge Instructions (Signed)
Keep ace wrap on for swelling. Keep her foot elevated. Use crutches for ambulation for the next several days. Prednisone until all gone. Norco for severe pain. Follow up with your primary care doctor for recheck. I suspect you may have tendonitis or gout.  Ankle Pain Ankle pain is a common symptom. The bones, cartilage, tendons, and muscles of the ankle joint perform a lot of work each day. The ankle joint holds your body weight and allows you to move around. Ankle pain can occur on either side or back of 1 or both ankles. Ankle pain may be sharp and burning or dull and aching. There may be tenderness, stiffness, redness, or warmth around the ankle. The pain occurs more often when a person walks or puts pressure on the ankle. CAUSES  There are many reasons ankle pain can develop. It is important to work with your caregiver to identify the cause since many conditions can impact the bones, cartilage, muscles, and tendons. Causes for ankle pain include:  Injury, including a break (fracture), sprain, or strain often due to a fall, sports, or a high-impact activity.  Swelling (inflammation) of a tendon (tendonitis).  Achilles tendon rupture.  Ankle instability after repeated sprains and strains.  Poor foot alignment.  Pressure on a nerve (tarsal tunnel syndrome).  Arthritis in the ankle or the lining of the ankle.  Crystal formation in the ankle (gout or pseudogout). DIAGNOSIS  A diagnosis is based on your medical history, your symptoms, results of your physical exam, and results of diagnostic tests. Diagnostic tests may include X-ray exams or a computerized magnetic scan (magnetic resonance imaging, MRI). TREATMENT  Treatment will depend on the cause of your ankle pain and may include:  Keeping pressure off the ankle and limiting activities.  Using crutches or other walking support (a cane or brace).  Using rest, ice, compression, and elevation.  Participating in physical therapy or home  exercises.  Wearing shoe inserts or special shoes.  Losing weight.  Taking medications to reduce pain or swelling or receiving an injection.  Undergoing surgery. HOME CARE INSTRUCTIONS   Only take over-the-counter or prescription medicines for pain, discomfort, or fever as directed by your caregiver.  Put ice on the injured area.  Put ice in a plastic bag.  Place a towel between your skin and the bag.  Leave the ice on for 15-20 minutes at a time, 03-04 times a day.  Keep your leg raised (elevated) when possible to lessen swelling.  Avoid activities that cause ankle pain.  Follow specific exercises as directed by your caregiver.  Record how often you have ankle pain, the location of the pain, and what it feels like. This information may be helpful to you and your caregiver.  Ask your caregiver about returning to work or sports and whether you should drive.  Follow up with your caregiver for further examination, therapy, or testing as directed. SEEK MEDICAL CARE IF:   Pain or swelling continues or worsens beyond 1 week.  You have an oral temperature above 102 F (38.9 C).  You are feeling unwell or have chills.  You are having an increasingly difficult time with walking.  You have loss of sensation or other new symptoms.  You have questions or concerns. MAKE SURE YOU:   Understand these instructions.  Will watch your condition.  Will get help right away if you are not doing well or get worse. Document Released: 04/14/2010 Document Revised: 01/17/2012 Document Reviewed: 04/14/2010 [employer] Patient Information 2015  [employer]. This information is not intended to replace advice given to you by your health care provider. Make sure you discuss any questions you have with your health care provider.

## 2015-03-22 NOTE — ED Provider Notes (Signed)
CSN: [id number]     Arrival date & time 03/22/15  2059 History  This chart was scribed for Gail Crumbleatyana Jodey Burbano, PA-C working with Jerelyn ScottMartha Linker, MD by Evon Slackerrance Branch, ED Scribe. This patient was seen in room TR10C/TR10C and the patient's care was started at 9:33 PM.    Chief Complaint  Patient presents with  . Ankle Pain    Patient is a 49 y.o. female presenting with ankle pain. The history is provided by the patient. No language interpreter was used.  Ankle Pain Associated symptoms: no fever    HPI Comments: Gail Jordan is a 49 y.o. female who presents to the Emergency Department complaining of left ankle pain onset 1 day ago. Pt states she has associated swelling. Pt states that the pain is worse when ambulating or bearing weight. Pt states she has tried Aleve with no relief. Pt denies injury to the ankle. Pt denies knee pain, numbness or tingling. She denies any prior similar pain. She denies any fever or chills. She denies any known injuries. Pain is mainly over medial ankle.  Patient is [place] speaking, translator phone views.  Past Medical History  Diagnosis Date  . Hypertension   . Obesity   . Hyperlipidemia    Past Surgical History  Procedure Laterality Date  . Retained placenta removal      after child birth  . Stomach surgery      possibly???   Family History  Problem Relation Age of Onset  . Hypertension Father   . Hypertension Sister    History  Substance Use Topics  . Smoking status: Former Smoker -- 2 years    Types: Cigarettes    Quit date: 11/08/2010  . Smokeless tobacco: Not on file     Comment: 1 pack per 1-2 weeks.   . Alcohol Use: Yes     Comment: once a month--wine   OB History    Gravida Para Term Preterm AB TAB SAB Ectopic Multiple Living   5 2 2  0 3 3 0 0 0 2     Review of Systems  Constitutional: Negative for fever and chills.  Musculoskeletal: Positive for joint swelling and arthralgias.  Skin: Negative for color change.   Neurological: Negative for seizures.  All other systems reviewed and are negative.    Allergies  Review of patient's allergies indicates no known allergies.  Home Medications   Prior to Admission medications   Medication Sig Start Date End Date Taking? Authorizing Provider  acetaminophen (TYLENOL) 500 MG tablet Take 1,000 mg by mouth every 6 (six) hours as needed. For pain/fever     Historical Provider, MD  atorvastatin (LIPITOR) 20 MG tablet Take 20 mg by mouth daily.    Historical Provider, MD  ibuprofen (ADVIL,MOTRIN) 100 MG tablet Take 200 mg by mouth every 6 (six) hours as needed.    Historical Provider, MD  lisinopril-hydrochlorothiazide (PRINZIDE,ZESTORETIC) 20-12.5 MG per tablet Take 1 tablet by mouth daily.      Historical Provider, MD   BP 151/90 mmHg  Pulse 84  Temp(Src) 98.8 F (37.1 C) (Oral)  Resp 20  SpO2 96%   Physical Exam  Constitutional: She is oriented to person, place, and time. She appears well-developed and well-nourished. No distress.  HENT:  Head: Normocephalic and atraumatic.  Eyes: Conjunctivae and EOM are normal.  Neck: Neck supple. No tracheal deviation present.  Cardiovascular: Normal rate.   Pulmonary/Chest: Effort normal. No respiratory distress.  Musculoskeletal: Normal range of motion.  A swelling to  the medial aspect of the left ankle. Tender to palpation over medial malleolus and posterior tendons extending into the arch of the foot. Normal dorsal foot. No tenderness over lateral malleolus. Normal toes. No erythema, warmth to touch over the joint. Pain with any range of motion of the ankle joint., No calf tenderness, negative Homans sign. No knee pain or tenderness to palpation.  Neurological: She is alert and oriented to person, place, and time.  Skin: Skin is warm and dry.  Psychiatric: She has a normal mood and affect. Her behavior is normal.  Nursing note and vitals reviewed.   ED Course  Procedures (including critical care  time) DIAGNOSTIC STUDIES: Oxygen Saturation is 96% on RA, normal by my interpretation.    COORDINATION OF CARE: 10:22 PM-Discussed treatment plan with pt at bedside and pt agreed to plan.     Labs Review Labs Reviewed - No data to display  Imaging Review Dg Ankle Complete Left  03/22/2015   CLINICAL DATA:  Left ankle swelling and pain beginning yesterday. Ankle is swollen and warm. Patient does not remember any injury.  EXAM: LEFT ANKLE COMPLETE - 3+ VIEW  COMPARISON:  None.  FINDINGS: Soft tissue swelling about the left ankle. No soft tissue gas or foreign body demonstrated. No evidence of acute fracture or dislocation. No focal bone lesion or bone erosion. Bone cortex and trabecular architecture appear intact.  IMPRESSION: Diffuse soft tissue swelling.  No bone erosion or acute fracture.   Electronically Signed   By: Burman NievesWilliam  Stevens M.D.   On: 03/22/2015 22:43     EKG Interpretation None      MDM   Final diagnoses:  Foot pain, left   Patient with nontraumatic pain to the left ankle. Exam is consistent with possible tendinitis versus gout. Patient is afebrile, joint is not warm, no erythema, doubt infection. X-rays negative. Patient is morbidly obese, discussed ways to watch her diet and start exercising more often. Ace wrap applied for swelling. Crutches provided. Instructed to stay off of the ankle for several days, keep it elevated, ice, will start on prednisone for possible gout, Norco for pain. Patient was given Toradol prior to discharge. Instructed to follow-up with her primary care doctor next week for recheck to make sure it is improving.   Filed Vitals:   03/22/15 2128  BP: 151/90  Pulse: 84  Temp: 98.8 F (37.1 C)  TempSrc: Oral  Resp: 20  SpO2: 96%     I personally performed the services described in this documentation, which was scribed in my presence. The recorded information has been reviewed and is accurate.    Gail Crumbleatyana Irene Collings, PA-C 03/22/15  2327  Dione Boozeavid Glick, MD 03/22/15 2352

## 2015-05-27 ENCOUNTER — Emergency Department (HOSPITAL_COMMUNITY)
Admission: EM | Admit: 2015-05-27 | Discharge: 2015-05-28 | Disposition: A | Discharge status: Home / Self Care | Payer: Self-pay | Attending: Emergency Medicine | Admitting: Emergency Medicine

## 2015-05-27 ENCOUNTER — Emergency Department (HOSPITAL_COMMUNITY): Payer: Self-pay

## 2015-05-27 ENCOUNTER — Encounter (HOSPITAL_COMMUNITY): Payer: Self-pay | Admitting: Physical Medicine and Rehabilitation

## 2015-05-27 DIAGNOSIS — R1031 Right lower quadrant pain: Secondary | ICD-10-CM | POA: Insufficient documentation

## 2015-05-27 DIAGNOSIS — I10 Essential (primary) hypertension: Secondary | ICD-10-CM | POA: Insufficient documentation

## 2015-05-27 DIAGNOSIS — E669 Obesity, unspecified: Secondary | ICD-10-CM | POA: Insufficient documentation

## 2015-05-27 DIAGNOSIS — E785 Hyperlipidemia, unspecified: Secondary | ICD-10-CM | POA: Insufficient documentation

## 2015-05-27 DIAGNOSIS — Z87891 Personal history of nicotine dependence: Secondary | ICD-10-CM | POA: Insufficient documentation

## 2015-05-27 DIAGNOSIS — R112 Nausea with vomiting, unspecified: Secondary | ICD-10-CM | POA: Insufficient documentation

## 2015-05-27 DIAGNOSIS — Z79899 Other long term (current) drug therapy: Secondary | ICD-10-CM | POA: Insufficient documentation

## 2015-05-27 DIAGNOSIS — R1011 Right upper quadrant pain: Secondary | ICD-10-CM | POA: Insufficient documentation

## 2015-05-27 LAB — CBC WITH DIFFERENTIAL/PLATELET
Basophils Absolute: 0 10*3/uL (ref 0.0–0.1)
Basophils Relative: 0 % (ref 0–1)
EOS PCT: 1 % (ref 0–5)
Eosinophils Absolute: 0.1 10*3/uL (ref 0.0–0.7)
HEMATOCRIT: 41 % (ref 36.0–46.0)
Hemoglobin: 13.2 g/dL (ref 12.0–15.0)
LYMPHS ABS: 2.8 10*3/uL (ref 0.7–4.0)
Lymphocytes Relative: 40 % (ref 12–46)
MCH: 27.8 pg (ref 26.0–34.0)
MCHC: 32.2 g/dL (ref 30.0–36.0)
MCV: 86.3 fL (ref 78.0–100.0)
MONO ABS: 0.6 10*3/uL (ref 0.1–1.0)
MONOS PCT: 9 % (ref 3–12)
NEUTROS ABS: 3.5 10*3/uL (ref 1.7–7.7)
NEUTROS PCT: 50 % (ref 43–77)
Platelets: 185 10*3/uL (ref 150–400)
RBC: 4.75 MIL/uL (ref 3.87–5.11)
RDW: 14.9 % (ref 11.5–15.5)
WBC: 7 10*3/uL (ref 4.0–10.5)

## 2015-05-27 LAB — COMPREHENSIVE METABOLIC PANEL
ALBUMIN: 4 g/dL (ref 3.5–5.0)
ALK PHOS: 48 U/L (ref 38–126)
ALT: 34 U/L (ref 14–54)
AST: 38 U/L (ref 15–41)
Anion gap: 7 (ref 5–15)
BILIRUBIN TOTAL: 0.8 mg/dL (ref 0.3–1.2)
BUN: 12 mg/dL (ref 6–20)
CALCIUM: 9.1 mg/dL (ref 8.9–10.3)
CHLORIDE: 102 mmol/L (ref 101–111)
CO2: 28 mmol/L (ref 22–32)
CREATININE: 0.97 mg/dL (ref 0.44–1.00)
GFR calc Af Amer: >60 mL/min (ref >60)
Glucose, Bld: 124 mg/dL — ABNORMAL HIGH (ref 65–99)
Potassium: 4.1 mmol/L (ref 3.5–5.1)
Sodium: 137 mmol/L (ref 135–145)
Total Protein: 8.3 g/dL — ABNORMAL HIGH (ref 6.5–8.1)

## 2015-05-27 LAB — URINALYSIS, ROUTINE W REFLEX MICROSCOPIC
Bilirubin Urine: NEGATIVE
Glucose, UA: NEGATIVE mg/dL
HGB URINE DIPSTICK: NEGATIVE
KETONES UR: NEGATIVE mg/dL
Leukocytes, UA: NEGATIVE
NITRITE: NEGATIVE
Protein, ur: NEGATIVE mg/dL
Specific Gravity, Urine: 1.015 (ref 1.005–1.030)
UROBILINOGEN UA: 1 mg/dL (ref 0.0–1.0)
pH: 7 (ref 5.0–8.0)

## 2015-05-27 LAB — LIPASE, BLOOD: LIPASE: 22 U/L (ref 22–51)

## 2015-05-27 MED ORDER — IBUPROFEN 600 MG PO TABS: 600.0000 mg | ORAL_TABLET | Freq: Four times a day (QID) | ORAL | Status: DC | PRN

## 2015-05-27 MED ORDER — HYDROCODONE-ACETAMINOPHEN 5-325 MG PO TABS
1.0000 | ORAL_TABLET | Freq: Four times a day (QID) | ORAL | Status: DC | PRN
Start: 2015-05-27 — End: 2017-03-04

## 2015-05-27 MED ORDER — ONDANSETRON 8 MG PO TBDP: 8.0000 mg | ORAL_TABLET | Freq: Three times a day (TID) | ORAL | Status: DC | PRN

## 2015-05-27 MED ORDER — IOHEXOL 300 MG/ML  SOLN
25.0000 mL | Freq: Once | INTRAMUSCULAR | Status: AC | PRN
  Administered 2015-05-27: 25 mL via ORAL

## 2015-05-27 MED ORDER — IOHEXOL 300 MG/ML  SOLN
100.0000 mL | Freq: Once | INTRAMUSCULAR | Status: AC | PRN
  Administered 2015-05-27: 100 mL via INTRAVENOUS

## 2015-05-27 MED ORDER — HYDROMORPHONE HCL 1 MG/ML IJ SOLN
1.0000 mg | Freq: Once | INTRAMUSCULAR | Status: AC
  Administered 2015-05-27: 1 mg via INTRAVENOUS
  Filled 2015-05-27: qty 1

## 2015-05-27 NOTE — Discharge Instructions (Signed)
We saw you in the ER for the abdominal pain. All the results in the ER are normal, labs and imaging. We are not sure what is causing your symptoms. The workup in the ER is not complete, and is limited to screening for life threatening and emergent conditions only, so please see a primary care doctor and gynecologist as requested.  Please return to the ER if your symptoms worsen; you have increased pain, fevers, chills, inability to keep any medications down, confusion. Otherwise see the outpatient doctor as requested.   Abdominal Pain, Women Abdominal (stomach, pelvic, or belly) pain can be caused by many things. It is important to tell your doctor:  The location of the pain.  Does it come and go or is it present all the time?  Are there things that start the pain (eating certain foods, exercise)?  Are there other symptoms associated with the pain (fever, nausea, vomiting, diarrhea)? All of this is helpful to know when trying to find the cause of the pain. CAUSES   Stomach: virus or bacteria infection, or ulcer.  Intestine: appendicitis (inflamed appendix), regional ileitis (Crohn's disease), ulcerative colitis (inflamed colon), irritable bowel syndrome, diverticulitis (inflamed diverticulum of the colon), or cancer of the stomach or intestine.  Gallbladder disease or stones in the gallbladder.  Kidney disease, kidney stones, or infection.  Pancreas infection or cancer.  Fibromyalgia (pain disorder).  Diseases of the female organs:  Uterus: fibroid (non-cancerous) tumors or infection.  Fallopian tubes: infection or tubal pregnancy.  Ovary: cysts or tumors.  Pelvic adhesions (scar tissue).  Endometriosis (uterus lining tissue growing in the pelvis and on the pelvic organs).  Pelvic congestion syndrome (female organs filling up with blood just before the menstrual period).  Pain with the menstrual period.  Pain with ovulation (producing an egg).  Pain with an IUD  (intrauterine device, birth control) in the uterus.  Cancer of the female organs.  Functional pain (pain not caused by a disease, may improve without treatment).  Psychological pain.  Depression. DIAGNOSIS  Your doctor will decide the seriousness of your pain by doing an examination.  Blood tests.  X-rays.  Ultrasound.  CT scan (computed tomography, special type of X-ray).  MRI (magnetic resonance imaging).  Cultures, for infection.  Barium enema (dye inserted in the large intestine, to better view it with X-rays).  Colonoscopy (looking in intestine with a lighted tube).  Laparoscopy (minor surgery, looking in abdomen with a lighted tube).  Major abdominal exploratory surgery (looking in abdomen with a large incision). TREATMENT  The treatment will depend on the cause of the pain.   Many cases can be observed and treated at home.  Over-the-counter medicines recommended by your caregiver.  Prescription medicine.  Antibiotics, for infection.  Birth control pills, for painful periods or for ovulation pain.  Hormone treatment, for endometriosis.  Nerve blocking injections.  Physical therapy.  Antidepressants.  Counseling with a psychologist or psychiatrist.  Minor or major surgery. HOME CARE INSTRUCTIONS   Do not take laxatives, unless directed by your caregiver.  Take over-the-counter pain medicine only if ordered by your caregiver. Do not take aspirin because it can cause an upset stomach or bleeding.  Try a clear liquid diet (broth or water) as ordered by your caregiver. Slowly move to a bland diet, as tolerated, if the pain is related to the stomach or intestine.  Have a thermometer and take your temperature several times a day, and record it.  Bed rest and sleep, if it  helps the pain.  Avoid sexual intercourse, if it causes pain.  Avoid stressful situations.  Keep your follow-up appointments and tests, as your caregiver orders.  If the pain  does not go away with medicine or surgery, you may try:  Acupuncture.  Relaxation exercises (yoga, meditation).  Group therapy.  Counseling. SEEK MEDICAL CARE IF:   You notice certain foods cause stomach pain.  Your home care treatment is not helping your pain.  You need stronger pain medicine.  You want your IUD removed.  You feel faint or lightheaded.  You develop nausea and vomiting.  You develop a rash.  You are having side effects or an allergy to your medicine. SEEK IMMEDIATE MEDICAL CARE IF:   Your pain does not go away or gets worse.  You have a fever.  Your pain is felt only in portions of the abdomen. The right side could possibly be appendicitis. The left lower portion of the abdomen could be colitis or diverticulitis.  You are passing blood in your stools (bright red or black tarry stools, with or without vomiting).  You have blood in your urine.  You develop chills, with or without a fever.  You pass out. MAKE SURE YOU:   Understand these instructions.  Will watch your condition.  Will get help right away if you are not doing well or get worse. Document Released: 08/22/2007 Document Revised: 03/11/2014 Document Reviewed: 09/11/2009 [facility] Patient Information 2015 [place], [place]. This information is not intended to replace advice given to you by your health care provider. Make sure you discuss any questions you have with your health care provider.

## 2015-05-27 NOTE — ED Notes (Signed)
Chaplain paged for pt comfort.

## 2015-05-27 NOTE — ED Notes (Addendum)
RN attempted to obtain lab work in triage, unable.

## 2015-05-27 NOTE — ED Notes (Signed)
Pt presents to department for evaluation of R sided abdominal pain and nausea/vomiting. Ongoing x3 days. 7/10 pain upon arrival to ED.

## 2015-05-27 NOTE — Progress Notes (Signed)
Chaplain responded to page for pt requesting support. Chaplain introduced herself to pt. Pt reported "sometimes I just want to kill myself." Chaplain reported this to nurse after visit. Pt described how she feels lonely and has no one to talk to. Additionally, her relationship with her children has grown strained. Chaplain listened empathetically and provided emotional support and prayer. Pt reported that she felt better after visit with chaplain. Page chaplain as needed.    05/27/15 2000  Clinical Encounter Type  Visited With Patient;Health care provider  Visit Type Initial;Spiritual support  Referral From Nurse  Spiritual Encounters  Spiritual Needs Emotional;Prayer  Stress Factors  Patient Stress Factors Family relationships;Major life changes  Jiles HaroldStamey, Nikolaus Pienta F, Chaplain 05/27/2015 8:44 PM

## 2015-05-27 NOTE — ED Provider Notes (Signed)
CSN: [id number]     Arrival date & time 05/27/15  1425 History   First MD Initiated Contact with Patient 05/27/15 1749     Chief Complaint  Patient presents with  . Abdominal Pain  . Nausea  . Emesis     (Consider location/radiation/quality/duration/timing/severity/associated sxs/prior Treatment) HPI Comments: Pain present for 1 week. Slowly getting worse. No hx of pelvic disorders.  Patient is a 49 y.o. female presenting with abdominal pain and vomiting. The history is provided by the patient.  Abdominal Pain Pain location:  RUQ and RLQ Pain quality: heavy   Pain radiates to:  Does not radiate Pain severity:  Moderate Duration:  1 week Timing:  Constant Progression:  Worsening Chronicity:  New Relieved by:  NSAIDs Associated symptoms: nausea and vomiting   Associated symptoms: no hematuria   Emesis Associated symptoms: abdominal pain     Past Medical History  Diagnosis Date  . Hypertension   . Obesity   . Hyperlipidemia    Past Surgical History  Procedure Laterality Date  . Retained placenta removal      after child birth  . Stomach surgery      possibly???   Family History  Problem Relation Age of Onset  . Hypertension Father   . Hypertension Sister    History  Substance Use Topics  . Smoking status: Former Smoker -- 2 years    Types: Cigarettes    Quit date: 11/08/2010  . Smokeless tobacco: Not on file     Comment: 1 pack per 1-2 weeks.   . Alcohol Use: Yes     Comment: once a month--wine   OB History    Gravida Para Term Preterm AB TAB SAB Ectopic Multiple Living   5 2 2  0 3 3 0 0 0 2     Review of Systems  Gastrointestinal: Positive for nausea, vomiting and abdominal pain.  Genitourinary: Negative for hematuria.  All other systems reviewed and are negative.     Allergies  Review of patient's allergies indicates no known allergies.  Home Medications   Prior to Admission medications   Medication Sig Start Date End Date Taking?  Authorizing Provider  acetaminophen (TYLENOL) 500 MG tablet Take 1,000 mg by mouth every 6 (six) hours as needed. For pain/fever    Yes Historical Provider, MD  atorvastatin (LIPITOR) 20 MG tablet Take 20 mg by mouth daily.   Yes Historical Provider, MD  losartan (COZAAR) 50 MG tablet Take 50 mg by mouth daily.   Yes Historical Provider, MD  HYDROcodone-acetaminophen (NORCO/VICODIN) 5-325 MG per tablet Take 1 tablet by mouth every 6 (six) hours as needed. 05/27/15   Derwood KaplanAnkit Thi Klich, MD  ibuprofen (ADVIL,MOTRIN) 600 MG tablet Take 1 tablet (600 mg total) by mouth every 6 (six) hours as needed. 05/27/15   Derwood KaplanAnkit Tea Collums, MD  ondansetron (ZOFRAN ODT) 8 MG disintegrating tablet Take 1 tablet (8 mg total) by mouth every 8 (eight) hours as needed for nausea. 05/27/15   Jaeveon Ashland, MD   BP 117/63 mmHg  Pulse 66  Temp(Src) 98.2 F (36.8 C) (Oral)  Resp 24  SpO2 96% Physical Exam  Constitutional: She is oriented to person, place, and time. She appears well-developed and well-nourished.  HENT:  Head: Normocephalic and atraumatic.  Eyes: EOM are normal. Pupils are equal, round, and reactive to light.  Neck: Neck supple.  Cardiovascular: Normal rate, regular rhythm and normal heart sounds.   No murmur heard. Pulmonary/Chest: Effort normal. No respiratory distress.  Abdominal: Soft.  She exhibits no distension. There is tenderness. There is guarding. There is no rebound.  RLQ tenderness, with Mcburney's, also RUQ tenderness, neg murphy's  Neurological: She is alert and oriented to person, place, and time.  Skin: Skin is warm and dry.  Nursing note and vitals reviewed.   ED Course  Procedures (including critical care time) Labs Review Labs Reviewed  COMPREHENSIVE METABOLIC PANEL - Abnormal; Notable for the following:    Glucose, Bld 124 (*)    Total Protein 8.3 (*)    All other components within normal limits  CBC WITH DIFFERENTIAL/PLATELET  LIPASE, BLOOD  URINALYSIS, ROUTINE W REFLEX  MICROSCOPIC (NOT AT [facility])  PREGNANCY, URINE    Imaging Review Ct Abdomen Pelvis W Contrast  05/27/2015   CLINICAL DATA:  Right upper and lower quadrant abdominal pain over the past 3 days associated with nausea and vomiting.  EXAM: CT ABDOMEN AND PELVIS WITH CONTRAST  TECHNIQUE: Multidetector CT imaging of the abdomen and pelvis was performed using the standard protocol following bolus administration of intravenous contrast.  CONTRAST:  OMNIPAQUE IOHEXOL 300 MG/ML IV. Oral contrast was also administered.  COMPARISON:  Right upper quadrant abdominal ultrasound earlier same day. CT abdomen and pelvis 08/02/2014, 05/25/2011.  FINDINGS: Hepatobiliary: Diffuse hepatic steatosis without focal hepatic parenchymal abnormality as noted on the earlier ultrasound. Normal-appearing gallbladder without calcified gallstones. No biliary ductal dilation.  Spleen:  Normal in size and appearance.  Pancreas:  Normal in appearance.  No pancreatic ductal dilation.  Adrenal glands:  Normal in appearance.  Genitourinary: Both kidneys normal in size and appearance. No evidence of urinary tract calculi. Normal-appearing decompressed urinary bladder.  Normal-appearing uterus and ovaries without evidence of adnexal mass or free pelvic fluid.  Gastrointestinal: Stomach normal in appearance for its decompressed state. Normal-appearing small bowel. Diffuse colonic diverticulosis without evidence of acute diverticulitis. Moderate colonic stool burden. Normal appendix in the right mid pelvis.  Ascites:  Absent.  Vascular:  No visible aortoiliofemoral atherosclerosis.  Lymphatic:  No pathologic lymphadenopathy in the abdomen or pelvis.  Other findings: Umbilical hernia containing fat. Numerous calcified injection granulomata in the subcutaneous tissues of the buttocks.  Musculoskeletal: Degenerative disc disease and spondylosis involving the visualized lower thoracic spine. Facet degenerative changes at L4-5 and L5-S1.  Visualized lower  thorax: Heart size normal.  Lung bases clear.  IMPRESSION: 1. No acute abnormalities involving the abdomen or pelvis. 2. Diffuse hepatic steatosis without focal hepatic parenchymal abnormality. 3. Diffuse colonic diverticulosis without evidence of acute diverticulitis.   Electronically Signed   By: Hulan Saas M.D.   On: 05/27/2015 21:13   US Abdomen Limited  05/27/2015   CLINICAL DATA:  Three day history of right upper quadrant abdominal pain associated with nausea and vomiting.  EXAM: US ABDOMEN LIMITED - RIGHT UPPER QUADRANT  COMPARISON:  CT abdomen and pelvis 08/02/2012, 05/25/2011. Complete abdominal ultrasound 05/25/2011.  FINDINGS: Gallbladder:  No shadowing gallstones or echogenic sludge. No gallbladder wall thickening or pericholecystic fluid. Negative sonographic Murphy sign according to the ultrasound technologist.  Common bile duct:  Diameter: Approximately 4 mm.  The distal duct was obscured by duodenal bowel gas.  Liver:  Markedly increased and coarsened echotexture diffusely without focal hepatic parenchymal abnormality. Patent portal vein with hepatopetal flow.  IMPRESSION: 1. Hepatic steatosis without focal hepatic parenchymal abnormality. 2. Otherwise normal examination.   Electronically Signed   By: Hulan Saas M.D.   On: 05/27/2015 20:00     EKG Interpretation None      @  11:55 CT is neg for acute process. Will d/c. PO challenge passed with contrast. Asked her to see pcp and gyne.  MDM   Final diagnoses:  Right lower quadrant abdominal pain    DDx includes: Pancreatitis Hepatobiliary pathology including cholecystitis Gastritis/PUD SBO ACS syndrome Tumors Colitis Intra abdominal abscess Thrombosis Mesenteric ischemia Diverticulitis Peritonitis Appendicitis Hernia Nephrolithiasis Pyelonephritis UTI/Cystitis Ovarian cyst TOA Ectopic pregnancy PID   Pt comes in with cc of abd pain. Abd pain x 1 week, worse with po intake. No vaginal bleeding,  discharge and denies any pelvic disorder. + ? Stomach surgery. No uti like sx.  Concerns primarily for appendicits, pud and cholelithiasis. She has no pelvic organ dysfunction, and pain x 1 week doesn't sound like torsion, since it is constant. She has no vaginal d/c and no risk factors for std.          Derwood Kaplan, MD 05/27/15 2355

## 2015-05-27 NOTE — ED Notes (Signed)
Dr. August Saucerean called to advise that patient needs to be R/O for acute appendicitis.

## 2016-03-09 ENCOUNTER — Encounter: Payer: Self-pay | Admitting: Adult Health

## 2016-03-09 ENCOUNTER — Ambulatory Visit (INDEPENDENT_AMBULATORY_CARE_PROVIDER_SITE_OTHER): Payer: PRIVATE HEALTH INSURANCE | Admitting: Adult Health

## 2016-03-09 VITALS — BP 132/94 | HR 77 | Temp 97.8°F | Ht 66.0 in | Wt 285.0 lb

## 2016-03-09 DIAGNOSIS — G4733 Obstructive sleep apnea (adult) (pediatric): Secondary | ICD-10-CM

## 2016-03-09 NOTE — Addendum Note (Signed)
Addended by: Karalee HeightOX, Omega Durante P on: 03/09/2016 02:31 PM   Modules accepted: Orders

## 2016-03-09 NOTE — Assessment & Plan Note (Signed)
Wt loss encouaged

## 2016-03-09 NOTE — Patient Instructions (Addendum)
Order sent to DME for a full face mask to DME.  Order for CPAP machine evaluation.  Restart CPAP At bedtime  .  Goal is to wear for 4-6hr each night.  Work on weight loss .  Do not drive if sleepy.  follow up .Dr. Lucy Chrisios in 3 months and As needed

## 2016-03-09 NOTE — Progress Notes (Signed)
Subjective:    Patient ID: Gail Jordan, female    DOB: 18-Sep-1966, 50 y.o.   MRN: [id number]  HPI 50 yo obese female with severe OSA on CPAP .  Former pt of Dr. Shelle Iron   TEST  NPSG 11/2012:  AHI 61/hr with desat to 69%  Past Medical History  Diagnosis Date  . Hypertension   . Obesity   . Hyperlipidemia    Current Outpatient Prescriptions on File Prior to Visit  Medication Sig Dispense Refill  . acetaminophen (TYLENOL) 500 MG tablet Take 1,000 mg by mouth every 6 (six) hours as needed. For pain/fever     . atorvastatin (LIPITOR) 20 MG tablet Take 20 mg by mouth daily.    Marland Kitchen HYDROcodone-acetaminophen (NORCO/VICODIN) 5-325 MG per tablet Take 1 tablet by mouth every 6 (six) hours as needed. 8 tablet 0  . ibuprofen (ADVIL,MOTRIN) 600 MG tablet Take 1 tablet (600 mg total) by mouth every 6 (six) hours as needed. 30 tablet 0  . losartan (COZAAR) 50 MG tablet Take 50 mg by mouth daily.    . ondansetron (ZOFRAN ODT) 8 MG disintegrating tablet Take 1 tablet (8 mg total) by mouth every 8 (eight) hours as needed for nausea. 20 tablet 0   No current facility-administered medications on file prior to visit.      03/09/2016 Follow up : OSA  Returns for one-year follow-up for sleep apnea. Patient is on nocturnal C Pap. Patient says that she's been having a lot of trouble with her C Pap has been unable  to wear it. She says that her mask is too small and she feels that she's not getting enough air. She request a full facemask. She also feels that her machine is not working as well. We discussed putting a new order into her home care company for evaluation of her machine and new mass. We discussed that her severe sleep apnea. Does need to be treated to avoid potential complications of untreated sleep apnea. She does have daytime sleepiness. And complains of snoring..  Past Medical History  Diagnosis Date  . Hypertension   . Obesity   . Hyperlipidemia    Current Outpatient Prescriptions on  File Prior to Visit  Medication Sig Dispense Refill  . acetaminophen (TYLENOL) 500 MG tablet Take 1,000 mg by mouth every 6 (six) hours as needed. For pain/fever     . atorvastatin (LIPITOR) 20 MG tablet Take 20 mg by mouth daily.    Marland Kitchen HYDROcodone-acetaminophen (NORCO/VICODIN) 5-325 MG per tablet Take 1 tablet by mouth every 6 (six) hours as needed. 8 tablet 0  . ibuprofen (ADVIL,MOTRIN) 600 MG tablet Take 1 tablet (600 mg total) by mouth every 6 (six) hours as needed. 30 tablet 0  . losartan (COZAAR) 50 MG tablet Take 50 mg by mouth daily.    . ondansetron (ZOFRAN ODT) 8 MG disintegrating tablet Take 1 tablet (8 mg total) by mouth every 8 (eight) hours as needed for nausea. 20 tablet 0   No current facility-administered medications on file prior to visit.     Review of Systems .Constitutional:   No  weight loss, night sweats,  Fevers, chills,  +fatigue, or  Lassitude.+snoring   HEENT:   No headaches,  Difficulty swallowing,  Tooth/dental problems, or  Sore throat,                No sneezing, itching, ear ache, nasal congestion, post nasal drip,   CV:  No chest pain,  Orthopnea, PND,  swelling in lower extremities, anasarca, dizziness, palpitations, syncope.   GI  No heartburn, indigestion, abdominal pain, nausea, vomiting, diarrhea, change in bowel habits, loss of appetite, bloody stools.   Resp: No shortness of breath with exertion or at rest.  No excess mucus, no productive cough,  No non-productive cough,  No coughing up of blood.  No change in color of mucus.  No wheezing.  No chest wall deformity  Skin: no rash or lesions.  GU: no dysuria, change in color of urine, no urgency or frequency.  No flank pain, no hematuria   MS:  No joint pain or swelling.  No decreased range of motion.  No back pain.  Psych:  No change in mood or affect. No depression or anxiety.  No memory loss.         Objective:   Physical Exam Filed Vitals:   03/09/16 1405  BP: 132/94  Pulse: 77    Temp: 97.8 F (36.6 C)  TempSrc: Oral  Height: 5\' 6"  (1.676 m)  Weight: 285 lb (129.275 kg)  SpO2: 96%   Body mass index is 46.02 kg/(m^2).  GEN: A/Ox3; pleasant , NAD, obese   HEENT:  [place]/AT,  EACs-clear, TMs-wnl, NOSE-clear, THROAT-clear, no lesions, no postnasal drip or exudate noted. Class 2-3 MP airway   NECK:  Supple w/ fair ROM; no JVD; normal carotid impulses w/o bruits; no thyromegaly or nodules palpated; no lymphadenopathy.  RESP  Clear  P & A; w/o, wheezes/ rales/ or rhonchi.no accessory muscle use, no dullness to percussion  CARD:  RRR, no m/r/g  , no peripheral edema, pulses intact, no cyanosis or clubbing.  GI:   Soft & nt; nml bowel sounds; no organomegaly or masses detected.  Musco: Warm bil, no deformities or joint swelling noted.   Neuro: alert, no focal deficits noted.    Skin: Warm, no lesions or rashes  Breckan Cafiero NP-C  [facility]  03/09/2016        Assessment & Plan:

## 2016-03-09 NOTE — Assessment & Plan Note (Signed)
Severe OSA -needs to get restarted on CPAP   Plan Order sent to DME for a full face mask to DME.  Order for CPAP machine evaluation.  Restart CPAP At bedtime  .  Goal is to wear for 4-6hr each night.  Work on weight loss .  Do not drive if sleepy.  follow up .Dr. Lucy Chrisios in 3 months and As needed

## 2016-05-30 ENCOUNTER — Emergency Department (HOSPITAL_COMMUNITY): Payer: Self-pay

## 2016-05-30 ENCOUNTER — Encounter (HOSPITAL_COMMUNITY): Payer: Self-pay

## 2016-05-30 ENCOUNTER — Emergency Department (HOSPITAL_COMMUNITY)
Admission: EM | Admit: 2016-05-30 | Discharge: 2016-05-30 | Disposition: A | Discharge status: Home / Self Care | Payer: Self-pay | Attending: Emergency Medicine | Admitting: Emergency Medicine

## 2016-05-30 DIAGNOSIS — R0600 Dyspnea, unspecified: Secondary | ICD-10-CM | POA: Insufficient documentation

## 2016-05-30 DIAGNOSIS — I10 Essential (primary) hypertension: Secondary | ICD-10-CM | POA: Insufficient documentation

## 2016-05-30 DIAGNOSIS — Z87891 Personal history of nicotine dependence: Secondary | ICD-10-CM | POA: Insufficient documentation

## 2016-05-30 DIAGNOSIS — R0789 Other chest pain: Secondary | ICD-10-CM | POA: Insufficient documentation

## 2016-05-30 DIAGNOSIS — Z79899 Other long term (current) drug therapy: Secondary | ICD-10-CM | POA: Insufficient documentation

## 2016-05-30 DIAGNOSIS — R079 Chest pain, unspecified: Secondary | ICD-10-CM

## 2016-05-30 DIAGNOSIS — E785 Hyperlipidemia, unspecified: Secondary | ICD-10-CM | POA: Insufficient documentation

## 2016-05-30 HISTORY — DX: Pure hypercholesterolemia, unspecified: E78.00

## 2016-05-30 LAB — BASIC METABOLIC PANEL
Anion gap: 7 (ref 5–15)
BUN: 12 mg/dL (ref 6–20)
CO2: 26 mmol/L (ref 22–32)
CREATININE: 0.69 mg/dL (ref 0.44–1.00)
Calcium: 9.1 mg/dL (ref 8.9–10.3)
Chloride: 104 mmol/L (ref 101–111)
GFR calc Af Amer: >60 mL/min (ref >60)
Glucose, Bld: 218 mg/dL — ABNORMAL HIGH (ref 65–99)
Potassium: 3.8 mmol/L (ref 3.5–5.1)
Sodium: 137 mmol/L (ref 135–145)

## 2016-05-30 LAB — CBC
HCT: 41.1 % (ref 36.0–46.0)
HEMOGLOBIN: 13.1 g/dL (ref 12.0–15.0)
MCH: 27.4 pg (ref 26.0–34.0)
MCHC: 31.9 g/dL (ref 30.0–36.0)
MCV: 86 fL (ref 78.0–100.0)
PLATELETS: 241 10*3/uL (ref 150–400)
RBC: 4.78 MIL/uL (ref 3.87–5.11)
RDW: 14.2 % (ref 11.5–15.5)
WBC: 5.4 10*3/uL (ref 4.0–10.5)

## 2016-05-30 LAB — I-STAT TROPONIN, ED
TROPONIN I, POC: 0 ng/mL (ref 0.00–0.08)
Troponin i, poc: 0 ng/mL (ref 0.00–0.08)

## 2016-05-30 LAB — I-STAT BETA HCG BLOOD, ED (MC, WL, AP ONLY)

## 2016-05-30 MED ORDER — IOPAMIDOL (ISOVUE-370) INJECTION 76%
INTRAVENOUS | Status: AC
  Administered 2016-05-30: 100 mL
  Filled 2016-05-30: qty 100

## 2016-05-30 MED ORDER — ACETAMINOPHEN 325 MG PO TABS
650.0000 mg | ORAL_TABLET | Freq: Once | ORAL | Status: AC
  Administered 2016-05-30: 650 mg via ORAL
  Filled 2016-05-30: qty 2

## 2016-05-30 NOTE — ED Triage Notes (Signed)
Patient here with sharp chest discomfort and feeling like she cant get a deep breath for several days, was seen by cardiology for same several days ago and told she was ok. On arrival appears anxious, alert and oriented. Speaking complete sentences with no difficulty

## 2016-05-30 NOTE — ED Notes (Signed)
Assumed care of pt for discharge

## 2016-05-30 NOTE — ED Notes (Signed)
IV attempted x 3, unsuccessful.

## 2016-05-30 NOTE — ED Notes (Signed)
Onset 1 month cough, runny nose and intermittant chest pain.  Chest pain is getting worse.  Pt feel she can't take deep breath.  Pt lives alone and is afraid she is going to die. Her children are in college.  Pt reports she is unable to walk far at work without making breathing noise.  Pain is coming every 20 minutes.  Resting makes better.  Walking makes worse.

## 2016-05-30 NOTE — ED Provider Notes (Signed)
[facility]-EMERGENCY DEPT Provider Note   CSN: [id number] Arrival date & time: 05/30/16  1404  First Provider Contact:  First MD Initiated Contact with Patient 05/30/16 1848        History   Chief Complaint Chief Complaint  Patient presents with  . Chest Pain  . Shortness of Breath    HPI Gail Jordan is a 50 y.o. female.  50 year old female with history of hypertension, hyperlipidemia, obesity presents for chest pain and shortness of breath. She reports that she has not been able to walk far distances for a long time because of shortness of breath. She reports that she has also had intermittent chest pain. She states that she was seen last week and was told that she needed to lose weight and that that was affecting her and she was ordered a new CPAP. She says that her blood pressure was high last week and he adjusted her blood pressure medication. Patient denies that the chest pain or shortness of breath has been getting worse or coming on with less and less exertion. She reports she does feel very nervous about it. She asked me to provide her with a work note to be excused from work for 1 week. She states that she only went to work for one day last week. 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 She denies radiation of her chest pain. No leg swelling. She has had some cough and runny nose associated with the intermittent chest pain.      Past Medical History:  Diagnosis Date  . High cholesterol   . Hyperlipidemia   . Hypertension   . Obesity     Patient Active Problem List   Diagnosis Date Noted  . Obesity, morbid (HCC) 03/09/2016  . DOE (dyspnea on exertion) 09/03/2014  . Hypertension 01/04/2013  . Dyslipidemia 01/04/2013  . GERD (gastroesophageal reflux disease) 01/04/2013  . OSA (obstructive sleep apnea) 10/27/2012    Past Surgical History:  Procedure Laterality Date  . RETAINED PLACENTA REMOVAL     after child birth  . STOMACH SURGERY     possibly???    OB History    Gravida Para Term Preterm AB Living   0 3 2   SAB TAB Ectopic Multiple Live Births   0 3 0 0         Home Medications    Prior to Admission medications   Medication Sig Start Date End Date Taking? Authorizing Provider  atorvastatin (LIPITOR) 20 MG tablet Take 20 mg by mouth daily.   Yes Historical Provider, MD  ibuprofen (ADVIL,MOTRIN) 200 MG tablet Take 200 mg by mouth every 6 (six) hours as needed for headache, mild pain or moderate pain.   Yes Historical Provider, MD  losartan (COZAAR) 50 MG tablet Take 50 mg by mouth daily.   Yes Historical Provider, MD  HYDROcodone-acetaminophen (NORCO/VICODIN) 5-325 MG per tablet Take 1 tablet by mouth every 6 (six) hours as needed. Patient not taking: Reported on 05/30/2016 05/27/15   Derwood Kaplan, MD  ibuprofen (ADVIL,MOTRIN) 600 MG tablet Take 1 tablet (600 mg total) by mouth every 6 (six) hours as needed. Patient not taking: Reported on 05/30/2016 05/27/15   Derwood Kaplan, MD  ondansetron (ZOFRAN ODT) 8 MG disintegrating tablet Take 1 tablet (8 mg total) by mouth every 8 (eight) hours as needed for nausea. Patient not taking: Reported on 05/30/2016 05/27/15   Derwood Kaplan, MD    Family  History Family History  Problem Relation Age of Onset  . Hypertension Father   . Hypertension Sister     Social History Social History  Substance Use Topics  . Smoking status: Former Smoker    Years: 2.00    Types: Cigarettes    Quit date: 11/08/2010  . Smokeless tobacco: Never Used     Comment: 1 pack per 1-2 weeks.   . Alcohol use Yes     Comment: once a month--wine     Allergies   Other   Review of Systems Review of Systems  Constitutional: Negative for appetite change, diaphoresis and fatigue.  HENT: Positive for congestion and rhinorrhea. Negative for postnasal drip.   Eyes: Negative for visual disturbance.  Respiratory: Positive for shortness of breath. Negative for cough and chest tightness.     Cardiovascular: Positive for chest pain. Negative for palpitations and leg swelling.  Gastrointestinal: Negative for abdominal pain, constipation, diarrhea, nausea and vomiting.  Genitourinary: Negative for dysuria, hematuria and urgency.  Musculoskeletal: Negative for back pain and myalgias.  Skin: Negative for rash.  Neurological: Negative for dizziness, weakness, numbness and headaches.  Hematological: Does not bruise/bleed easily.     Physical Exam Updated Vital Signs BP 135/89   Pulse 73   Temp 98.4 F (36.9 C) (Oral)   Resp 20   Ht 5\' 6"  (1.676 m)   Wt 289 lb (131.1 kg)   SpO2 97%   BMI 46.65 kg/m   Physical Exam  Constitutional: She is oriented to person, place, and time. She appears well-developed and well-nourished. No distress.  Obese  HENT:  Head: Normocephalic and atraumatic.  Right Ear: External ear normal.  Left Ear: External ear normal.  Nose: Nose normal.  Mouth/Throat: Oropharynx is clear and moist. No oropharyngeal exudate.  Eyes: EOM are normal. Pupils are equal, round, and reactive to light.  Neck: Normal range of motion. Neck supple.  Cardiovascular: Normal rate, regular rhythm, normal heart sounds and intact distal pulses.   No murmur heard. Pulmonary/Chest: Effort normal. No respiratory distress. She has no wheezes. She has no rales.  Abdominal: Soft. She exhibits no distension. There is no tenderness.  Musculoskeletal: Normal range of motion. She exhibits no edema or tenderness.  Neurological: She is alert and oriented to person, place, and time.  Skin: Skin is warm and dry. No rash noted. She is not diaphoretic.  Vitals reviewed.    ED Treatments / Results  Labs (all labs ordered are listed, but only abnormal results are displayed) Labs Reviewed  BASIC METABOLIC PANEL - Abnormal; Notable for the following:       Result Value   Glucose, Bld 218 (*)    All other components within normal limits  CBC  I-STAT TROPOININ, ED  I-STAT BETA HCG  BLOOD, ED ([facility], [facility], [facility] ONLY)  I-STAT TROPOININ, ED    EKG  EKG Interpretation  Date/Time:  Sunday May 30 2016 14:14:04 EDT Ventricular Rate:  94 PR Interval:  172 QRS Duration: 92 QT Interval:  366 QTC Calculation: 457 R Axis:   -10 Text Interpretation:  Normal sinus rhythm Cannot rule out Anterior infarct , age undetermined Abnormal ECG No significant change since last tracing Confirmed by Tyrone Apple ([id number]) on 05/30/2016 6:50:15 PM       Radiology Dg Chest 2 View  Result Date: 05/30/2016 CLINICAL DATA:  Patient here with sharp chest discomfort and feeling like she cant get a deep breath for several days, was seen by cardiology for same several  days ago and told she was ok. Hx HTN EXAM: CHEST  2 VIEW COMPARISON:  09/03/2014 FINDINGS: Lateral view degraded by patient arm position. Midline trachea. Mild cardiomegaly. Atherosclerosis in the transverse aorta. No pleural effusion or pneumothorax. Mild right hemidiaphragm elevation. No congestive failure. Clear lungs. IMPRESSION: No acute cardiopulmonary disease. Cardiomegaly without congestive failure. Aortic atherosclerosis.  This is age advanced. Electronically Signed   By: Jeronimo Greaves M.D.   On: 05/30/2016 15:17  Ct Angio Chest Pe W/cm &/or Wo Cm  Result Date: 05/30/2016 CLINICAL DATA:  50 year old female with chest discomfort EXAM: CT ANGIOGRAPHY CHEST WITH CONTRAST TECHNIQUE: Multidetector CT imaging of the chest was performed using the standard protocol during bolus administration of intravenous contrast. Multiplanar CT image reconstructions and MIPs were obtained to evaluate the vascular anatomy. CONTRAST:  100 cc Isovue 370 COMPARISON:  05/30/2016 FINDINGS: Edit the lungs are clear. There is no pleural effusion or pneumothorax. The central airways are patent. Minimal atherosclerotic calcification of the thoracic aorta. No dissection or aneurysm. The origins of the great vessels of the aortic arch appear patent. Evaluation of the  pulmonary arteries is limited due to streak artifact and suboptimal opacification and timing of the contrast. No definite central pulmonary artery embolus identified. Top-normal cardiac size. No pericardial effusion. There is no hilar or mediastinal adenopathy. The esophagus is grossly unremarkable. No thyroid nodules identified. There is no axillary adenopathy. The chest wall soft tissues appear unremarkable. There is mild degenerative changes of the spine. No acute osseous pathology. The visualized upper abdomen appears unremarkable. Review of the MIP images confirms the above findings. IMPRESSION: No CT evidence of central pulmonary artery embolus. V/Q scan may provide additional evaluation if there is high clinical concern for PE. Electronically Signed   By: Elgie Collard M.D.   On: 05/30/2016 21:26   Procedures Procedures (including critical care time)  Medications Ordered in ED Medications  iopamidol (ISOVUE-370) 76 % injection (100 mLs  Contrast Given 05/30/16 2045)  acetaminophen (TYLENOL) tablet 650 mg (650 mg Oral Given 05/30/16 2149)     Initial Impression / Assessment and Plan / ED Course  I have reviewed the triage vital signs and the nursing notes.  Pertinent labs & imaging results that were available during my care of the patient were reviewed by me and considered in my medical decision making (see chart for details).  Clinical Course    Patient was seen and evaluated in stable condition. Atypical symptoms although exertional component to shortness of breath and chest pain. If anything this appears to be a form of angina but not unstable angina as symptoms have not been progressively getting worse. EKG without acute change from previous. Troponins 2 unremarkable. CT angios without acute process. Patient does appear somewhat anxious. She said she can follow-up with her primary care physician. Patient was instructed to take aspirin and follow-up with her primary care physician  tomorrow.  Final Clinical Impressions(s) / ED Diagnoses   Final diagnoses:  Chest pain, unspecified chest pain type  Dyspnea    New Prescriptions Discharge Medication List as of 05/30/2016 11:23 PM       Leta Baptist, MD 05/31/16 [phone]

## 2016-05-30 NOTE — Discharge Instructions (Signed)
You were seen and evaluated today for your chest pain.  Please, follow up with your primary care physician for further evaluation as soon as possible.  Take aspirin daily until you seen your primary care physician.

## 2016-06-10 ENCOUNTER — Ambulatory Visit: Payer: PRIVATE HEALTH INSURANCE | Admitting: Pulmonary Disease

## 2016-07-05 ENCOUNTER — Encounter: Payer: Self-pay | Admitting: Pulmonary Disease

## 2016-07-05 ENCOUNTER — Ambulatory Visit (INDEPENDENT_AMBULATORY_CARE_PROVIDER_SITE_OTHER): Payer: Self-pay | Admitting: Pulmonary Disease

## 2016-07-05 DIAGNOSIS — G4733 Obstructive sleep apnea (adult) (pediatric): Secondary | ICD-10-CM

## 2016-07-05 DIAGNOSIS — K219 Gastro-esophageal reflux disease without esophagitis: Secondary | ICD-10-CM

## 2016-07-05 DIAGNOSIS — J4520 Mild intermittent asthma, uncomplicated: Secondary | ICD-10-CM

## 2016-07-05 DIAGNOSIS — J45909 Unspecified asthma, uncomplicated: Secondary | ICD-10-CM | POA: Insufficient documentation

## 2016-07-05 MED ORDER — FLUTICASONE FUROATE-VILANTEROL 200-25 MCG/INH IN AEPB: 1.0000 | INHALATION_SPRAY | Freq: Every day | RESPIRATORY_TRACT | 0 refills | Status: DC

## 2016-07-05 NOTE — Addendum Note (Signed)
Addended by: Vito BackersOX, Marcy Sookdeo H on: 07/05/2016 04:04 PM   Modules accepted: Orders

## 2016-07-05 NOTE — Assessment & Plan Note (Addendum)
PSG 11/2012:  AHI 61/hr with desat to 69% Patient was on CPAP in 2016 but was taken away from her as she had ObamaCare and her DME then was not in network. Very symptomatic now. Has hypersomnia, snoring, fatigue, witnessed apnea, gasping, choking.  We extensively discussed the diagnosis, pathophysiology, and treatment options for Obstructive Sleep Apnea (OSA).  We discussed treatment options for OSA including CPAP, BiPaP, as well as surgical options and oral devices.   Unfortunately, patient does not have medical insurance. Encouraged her to ask for help as far as obtaining medical insurance. We will give her a number to call for her to determine whether she can get some insurance via [facility].   We will also apply for an auto CPAP machine through CPAP assistance program from the [employer].  CPAP machine will be for $100. Unfortunately, there will be no warranty for the machine and she'll only get 1 set of supplies. If she gets that cpap, that'll be better than no machine at all. She can start using that machine as she is working on Museum/gallery curatorgetting insurance.  We will start patient on autocpap 5-14 cm water.  Patient was instructed to call the office if he/she has not received the PAP device in 1-2 weeks.  Patient was instructed to have mask, tubings, filter, reservoir cleaned at least once a week with soapy water.  Patient was instructed to call the office if he/she is having issues with the PAP device.    I advised patient to obtain sufficient amount of sleep --  7 to 8 hours at least in a 24 hr period.  Patient was advised to follow good sleep hygiene.  Patient was advised NOT to engage in activities requiring concentration and/or vigilance if he/she is and  sleepy.  Patient is NOT to drive if he/she is sleepy.    Patient was advised to call the office when she gets her CPAP machine so we can schedule follow-up accordingly.

## 2016-07-05 NOTE — Patient Instructions (Signed)
  It was a pleasure taking care of you today!  You are diagnosed with Obstructive Sleep Apnea or OSA.  You stop breathing  61 times/hr.   We will order you a autoCPAP machine.  We will try to get you a machine through a program wherein the cost of the cpap machine will be minimal.  Please call the office if you do NOT receive your machine in the next 1-2 weeks.   Please make sure you use your CPAP device everytime you sleep.  We will monitor the usage of your machine per your insurance requirement.  Your insurance company may take the machine from you if you are not using it regularly.   Please clean the mask, tubings, filter, water reservoir with soapy water every week.  Please use distilled water for the water reservoir.   Please call the office or your machine provider (DME company) if you are having issues with the device.   Give us a call once you receive your cpap machine.   We will start you on Breo. 1 puff daily for asthma.    Return to clinic 6 weeks after obtaining CPAP machine.

## 2016-07-05 NOTE — Assessment & Plan Note (Signed)
Weight reduction 

## 2016-07-05 NOTE — Assessment & Plan Note (Addendum)
SOB likely related to obesity.  Questionable history of asthma. I doubt. Occasional SOB and wheeze with change in weather.  Trial with breo 200 mcg/puff, 1 puff daily.  Once she has her insurance, will need PFT, ABG.

## 2016-07-05 NOTE — Progress Notes (Signed)
Subjective:    Patient ID: Gail Jordan, female    DOB: Feb 11, 1966, 50 y.o.   MRN: [id number]  HPI Patient is being seen for severe sleep apnea AND sob.   ROV 07/05/16  patient was seen previously by Dr. Shelle Ironlance and was seen by Tammy Parrett in 03/2016. She has severe sleep apnea with an AHI of 61and a study in 2014. She had a cpap machine in 2016 but DME took it away from her in 12/2015 as she was out of network. Patient also with exertional dyspnea, occasional wheezing. Denies any allergies. Usually the dyspnea is associated with change in weather.   Review of Systems  Constitutional: Negative.   HENT: Negative.   Eyes: Negative.   Respiratory: Positive for shortness of breath.   Cardiovascular: Negative.   Gastrointestinal: Negative.   Endocrine: Negative.   Genitourinary: Negative.   Musculoskeletal: Negative.   Skin: Negative.   Allergic/Immunologic: Negative.   Neurological: Negative.   Hematological: Negative.   Psychiatric/Behavioral: Negative.   All other systems reviewed and are negative.      Objective:   Physical Exam  Vitals:  Vitals:   07/05/16 1143  BP: 132/88  Pulse: 87  SpO2: 95%  Weight: 298 lb 12.8 oz (135.5 kg)  Height: 5\' 6"  (1.676 m)    Constitutional/General:  Pleasant, well-nourished, well-developed, not in any distress,  Comfortably seating.  Well kempt  Body mass index is 48.23 kg/m. Wt Readings from Last 3 Encounters:  07/05/16 298 lb 12.8 oz (135.5 kg)  05/30/16 289 lb (131.1 kg)  03/09/16 285 lb (129.3 kg)    Neck circumference:   HEENT: Pupils equal and reactive to light and accommodation. Anicteric sclerae. Normal nasal mucosa.   No oral  lesions,  mouth clear,  oropharynx clear, no postnasal drip. (-) Oral thrush. No dental caries.  Airway - Mallampati class III  Neck: No masses. Midline trachea. No JVD, (-) LAD. (-) bruits appreciated.  Respiratory/Chest: Grossly normal chest. (-) deformity. (-) Accessory muscle use.    Symmetric expansion. (-) Tenderness on palpation.  Resonant on percussion.  Diminished BS on both lower lung zones. (-) wheezing, crackles, rhonchi (-) egophony  Cardiovascular: Regular rate and  rhythm, heart sounds normal, no murmur or gallops, no peripheral edema  Gastrointestinal:  Normal bowel sounds. Soft, non-tender. No hepatosplenomegaly.  (-) masses.   Musculoskeletal:  Normal muscle tone. Normal gait.   Extremities: Grossly normal. (-) clubbing, cyanosis.  (-) edema  Skin: (-) rash,lesions seen.   Neurological/Psychiatric : alert, oriented to time, place, person. Normal mood and affect          Assessment & Plan:  OSA (obstructive sleep apnea) PSG 11/2012:  AHI 61/hr with desat to 69% Patient was on CPAP in 2016 but was taken away from her as she had ObamaCare and her DME then was not in network. Very symptomatic now. Has hypersomnia, snoring, fatigue, witnessed apnea, gasping, choking.  We extensively discussed the diagnosis, pathophysiology, and treatment options for Obstructive Sleep Apnea (OSA).  We discussed treatment options for OSA including CPAP, BiPaP, as well as surgical options and oral devices.   Unfortunately, patient does not have medical insurance. Encouraged her to ask for help as far as obtaining medical insurance. We will give her a number to call for her to determine whether she can get some insurance via [facility].   We will also apply for an auto CPAP machine through CPAP assistance program from the [employer].  CPAP machine will be for $100. Unfortunately, there will be no warranty for the machine and she'll only get 1 set of supplies. If she gets that cpap, that'll be better than no machine at all. 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  We will start patient on autocpap 5-14 cm water.  Patient was instructed to call the office if he/she has not received the PAP device in 1-2  weeks.  Patient was instructed to have mask, tubings, filter, reservoir cleaned at least once a week with soapy water.  Patient was instructed to call the office if he/she is having issues with the PAP device.    I advised patient to obtain sufficient amount of sleep --  7 to 8 hours at least in a 24 hr period.  Patient was advised to follow good sleep hygiene.  Patient was advised NOT to engage in activities requiring concentration and/or vigilance if he/she is and  sleepy.  Patient is NOT to drive if he/she is sleepy.    Patient was advised to call the office when she gets her CPAP machine so we can schedule follow-up accordingly.   Obesity, morbid (HCC) Weight reduction  Asthma SOB likely related to obesity.  Questionable history of asthma. I doubt. Occasional SOB and wheeze with change in weather.  Trial with breo 200 mcg/puff, 1 puff daily.  Once she has her insurance, will need PFT, ABG.  GERD (gastroesophageal reflux disease) Diet chnages.   Return to clinic in 4-6 weeks after obtaining her CPAP machine. Patient will give [place] a call so we can schedule.    Gail Meyer, MD 07/05/2016, 2:21 PM  [facility] Pager [phone] After 3 pm or if no answer, call [phone]   v

## 2016-07-05 NOTE — Assessment & Plan Note (Signed)
Diet chnages.  

## 2016-08-02 ENCOUNTER — Telehealth: Payer: Self-pay | Admitting: Pulmonary Disease

## 2016-08-02 NOTE — Telephone Encounter (Signed)
Samples have been given to the pt. Nothing further was needed. 

## 2016-09-01 ENCOUNTER — Encounter: Payer: Self-pay | Admitting: Pulmonary Disease

## 2016-09-01 ENCOUNTER — Ambulatory Visit (INDEPENDENT_AMBULATORY_CARE_PROVIDER_SITE_OTHER): Payer: Self-pay | Admitting: Pulmonary Disease

## 2016-09-01 DIAGNOSIS — G4733 Obstructive sleep apnea (adult) (pediatric): Secondary | ICD-10-CM

## 2016-09-01 DIAGNOSIS — J4520 Mild intermittent asthma, uncomplicated: Secondary | ICD-10-CM

## 2016-09-01 NOTE — Progress Notes (Signed)
Subjective:    Patient ID: Jehieli Karma GreaserM Remund, female    DOB: Mar 22, 1966, 50 y.o.   MRN: [id number]  HPI Patient is being seen for severe sleep apnea AND sob.   ROV 07/05/16  patient was seen previously by Dr. Shelle Ironlance and was seen by Tammy Parrett in 03/2016. She has severe sleep apnea with an AHI of 61and a study in 2014. She had a cpap machine in 2016 but DME took it away from her in 12/2015 as she was out of network. Patient also with exertional dyspnea, occasional wheezing. Denies any allergies. Usually the dyspnea is associated with change in weather.  ROV 09/01/16 Patient returns to the office as follow-up on her sleep apnea. Since last seen, she has not done her part as far as paying for her  CPAP machine which we applied with her when she was here last time. We told her to pay online but she did not do it. She said she does not have computer Internet. She tried calling the company but it was only a Engineer, technical salesvoicemail. She has also not applied for insurance.   She  remains to have symptoms of sleep apnea.  Review of Systems  Constitutional: Negative.   HENT: Negative.   Eyes: Negative.   Respiratory: Positive for shortness of breath.   Cardiovascular: Negative.   Gastrointestinal: Negative.   Endocrine: Negative.   Genitourinary: Negative.   Musculoskeletal: Negative.   Skin: Negative.   Allergic/Immunologic: Negative.   Neurological: Negative.   Hematological: Negative.   Psychiatric/Behavioral: Negative.   All other systems reviewed and are negative.      Objective:   Physical Exam  Vitals:  Vitals:   09/01/16 1151  BP: 124/84  Pulse: 81  SpO2: 96%  Weight: 299 lb 12.8 oz (136 kg)  Height: 5\' 6"  (1.676 m)    Constitutional/General:  Pleasant, well-nourished, well-developed, not in any distress,  Comfortably seating.  Well kempt  Body mass index is 48.39 kg/m. Wt Readings from Last 3 Encounters:  09/01/16 299 lb 12.8 oz (136 kg)  07/05/16 298 lb 12.8 oz (135.5 kg)    05/30/16 289 lb (131.1 kg)    HEENT: Pupils equal and reactive to light and accommodation. Anicteric sclerae. Normal nasal mucosa.   No oral  lesions,  mouth clear,  oropharynx clear, no postnasal drip. (-) Oral thrush. No dental caries.  Airway - Mallampati class III-IV  Neck: No masses. Midline trachea. No JVD, (-) LAD. (-) bruits appreciated.  Respiratory/Chest: Grossly normal chest. (-) deformity. (-) Accessory muscle use.  Symmetric expansion. (-) Tenderness on palpation.  Resonant on percussion.  Diminished BS on both lower lung zones. (-) wheezing, crackles, rhonchi (-) egophony  Cardiovascular: Regular rate and  rhythm, heart sounds normal, no murmur or gallops, no peripheral edema  Gastrointestinal:  Normal bowel sounds. Soft, non-tender. No hepatosplenomegaly.  (-) masses.   Musculoskeletal:  Normal muscle tone. Normal gait.   Extremities: Grossly normal. (-) clubbing, cyanosis.  (-) edema  Skin: (-) rash,lesions seen.   Neurological/Psychiatric : alert, oriented to time, place, person. Normal mood and affect          Assessment & Plan:  OSA (obstructive sleep apnea) PSG 11/2012:  AHI 61/hr with desat to 69% Patient was on CPAP in 2016 but was taken away from her as she had ObamaCare and her DME then was not in network. Very symptomatic now. Has hypersomnia, snoring, fatigue, witnessed apnea, gasping, choking.  We extensively discussed the diagnosis, pathophysiology, and  treatment options for Obstructive Sleep Apnea (OSA).  We discussed treatment options for OSA including CPAP, BiPaP, as well as surgical options and oral devices.   Unfortunately, patient does not have medical insurance.  We applied  for an auto CPAP machine through CPAP assistance program from the [employer] when I saw her the last time. We filled out the paperwork and faxed it to the company. She was not able to do her part, pay online, as she says she does not have computer  Internet. She has also not applied for any financial help.    I mentioned to her she should  ask for help as far as obtaining her CPAP machine and as far as obtaining financial help for medical insurance. I have discussed it with Cordelia Pen, our [facility], and she will give the information to the patient. She will give her numbers to call with regards to obtaining medical insurance help from the hospital. Mentioned to the patient also that she should try to follow up with that CPAP assistance program so she can get her CPAP machine.    Obesity, morbid (HCC) Weight reduction  Asthma SOB likely related to obesity.  Questionable history of asthma. I doubt. Occasional SOB and wheeze with change in weather.  Will need PFT and ABG once with insurance.   RTC once she has her CPAP machine.    Pollie Meyer, MD 09/01/2016, 12:56 PM [facility] Pager [phone] After 3 pm or if no answer, call [phone]   v

## 2016-09-01 NOTE — Assessment & Plan Note (Signed)
Weight reduction 

## 2016-09-01 NOTE — Patient Instructions (Signed)
  It was a pleasure taking care of you today!  Please work with us as far as trying to follow-up with obtaining her CPAP machine.  We will also help you to avail of medical insurance/fiancial help from [facility].   Return to clinic in after you obtain your cpap machine. Please call to make an appointment.

## 2016-09-01 NOTE — Assessment & Plan Note (Signed)
SOB likely related to obesity.  Questionable history of asthma. I doubt. Occasional SOB and wheeze with change in weather.  Will need PFT and ABG once with insurance.

## 2016-09-01 NOTE — Assessment & Plan Note (Addendum)
PSG 11/2012:  AHI 61/hr with desat to 69% Patient was on CPAP in 2016 but was taken away from her as she had ObamaCare and her DME then was not in network. Very symptomatic now. Has hypersomnia, snoring, fatigue, witnessed apnea, gasping, choking.  We extensively discussed the diagnosis, pathophysiology, and treatment options for Obstructive Sleep Apnea (OSA).  We discussed treatment options for OSA including CPAP, BiPaP, as well as surgical options and oral devices.   Unfortunately, patient does not have medical insurance.  We applied  for an auto CPAP machine through CPAP assistance program from the [employer] when I saw her the last time. We filled out the paperwork and faxed it to the company. She was not able to do her part, pay online, as she says she does not have computer Internet. She has also not applied for any financial help.    I mentioned to her she should  ask for help as far as obtaining her CPAP machine and as far as obtaining financial help for medical insurance. I have discussed it with Cordelia PenSherry, our [facility], and she will give the information to the patient. She will give her numbers to call with regards to obtaining medical insurance help from the hospital. Mentioned to the patient also that she should try to follow up with that CPAP assistance program so she can get her CPAP machine.

## 2016-09-08 ENCOUNTER — Ambulatory Visit (INDEPENDENT_AMBULATORY_CARE_PROVIDER_SITE_OTHER): Payer: Self-pay | Admitting: Acute Care

## 2016-09-08 ENCOUNTER — Telehealth: Payer: Self-pay | Admitting: Pulmonary Disease

## 2016-09-08 ENCOUNTER — Ambulatory Visit: Payer: Self-pay | Admitting: Adult Health

## 2016-09-08 ENCOUNTER — Encounter: Payer: Self-pay | Admitting: Acute Care

## 2016-09-08 VITALS — BP 138/82 | HR 81 | Ht 66.0 in | Wt 303.8 lb

## 2016-09-08 DIAGNOSIS — R0609 Other forms of dyspnea: Secondary | ICD-10-CM

## 2016-09-08 DIAGNOSIS — J4520 Mild intermittent asthma, uncomplicated: Secondary | ICD-10-CM

## 2016-09-08 LAB — POCT INFLUENZA A/B
Influenza A, POC: NEGATIVE
Influenza B, POC: NEGATIVE

## 2016-09-08 MED ORDER — FLUTICASONE FUROATE-VILANTEROL 200-25 MCG/INH IN AEPB: 1.0000 | INHALATION_SPRAY | Freq: Every day | RESPIRATORY_TRACT | 6 refills | Status: DC

## 2016-09-08 MED ORDER — ALBUTEROL SULFATE HFA 108 (90 BASE) MCG/ACT IN AERS: 2.0000 | INHALATION_SPRAY | Freq: Four times a day (QID) | RESPIRATORY_TRACT | 6 refills | Status: DC | PRN

## 2016-09-08 NOTE — Telephone Encounter (Signed)
Pt was in lobby requesting breo 200.  After speaking with pt  I noticed she was working to breath while at rest. I consulted with TP and SG. SG is seeing pt in office today. Nothing further needed.

## 2016-09-08 NOTE — Progress Notes (Signed)
History of Present Illness Gail Jordan is a 50 y.o. female morbidly obese former smoker ( quit 2012) with OSA, asthma  and Dyspnea. She is followed by Dr. Christene Slatese Dios.   09/08/2016 Acute OV: Pt. Presented to the office to pick up Breo samples and was noted by the staff to e very short of breath. Her oxygen saturations were 97%,however she is short of breath. She is morbidly obese, and deconditioned. She was given Breo 200  samples 08/02/16 , and she states she is compliant with use daily. She ran out of the samples 3 days ago. She states she has had increased shortness of breath for about 1 week. She also has been very anxious as she was hired for a job, and went to orientation where they stated they had over hired, and they will call her when they have an opening. She states she has had fevers although she has not checked one. She has just felt cold, and she did take a Tylenol for this yesterday.She is not febrile in the office today. ( 97.3) She has not had any sick contacts. She does not have a productive or non-productive cough. She does not have secretions, PND or tender sinuses.She denies chest pain, orthopnea, or hemoptysis. She denies any swelling in her feet or lower extremities. She has not had her flu vaccine this year.No recent travel. No history of DVT, PE or clotting disorder, no exogenous estrogen. She does not have insurance, and cost of medications is an issue for her.We will give her a coupon for a free Breo which will last one month. I told her to check with us or her PCP for additional samples prior to this sample running out. She verbalized understanding.  Tests CXR 05/30/2016  FINDINGS: Lateral view degraded by patient arm position. Midline trachea. Mild cardiomegaly. Atherosclerosis in the transverse aorta. No pleural effusion or pneumothorax. Mild right hemidiaphragm elevation. No congestive failure. Clear lungs. IMPRESSION: No acute cardiopulmonary disease. Cardiomegaly  without congestive failure. Aortic atherosclerosis.  This is age advanced.  Past medical hx Past Medical History:  Diagnosis Date  . High cholesterol   . Hyperlipidemia   . Hypertension   . Obesity      Past surgical hx, Family hx, Social hx all reviewed.  Current Outpatient Prescriptions on File Prior to Visit  Medication Sig  . atorvastatin (LIPITOR) 20 MG tablet Take 20 mg by mouth daily.  Marland Kitchen. HYDROcodone-acetaminophen (NORCO/VICODIN) 5-325 MG per tablet Take 1 tablet by mouth every 6 (six) hours as needed.  Marland Kitchen. ibuprofen (ADVIL,MOTRIN) 200 MG tablet Take 200 mg by mouth every 6 (six) hours as needed for headache, mild pain or moderate pain.  Marland Kitchen. losartan (COZAAR) 50 MG tablet Take 50 mg by mouth daily.  . ondansetron (ZOFRAN ODT) 8 MG disintegrating tablet Take 1 tablet (8 mg total) by mouth every 8 (eight) hours as needed for nausea.   No current facility-administered medications on file prior to visit.      Allergies  Allergen Reactions  . Other Itching, Swelling and Other (See Comments)    (Unknown) antihistamine made eyes swell and have discharge    Review Of Systems:  Constitutional:   No  weight loss, night sweats,  +Fevers, +chills, +fatigue, or  Lassitude, + body aches.  HEENT:   No headaches,  Difficulty swallowing,  Tooth/dental problems, or  Sore throat,                No sneezing, itching, ear ache,  nasal congestion, post nasal drip,   CV:  No chest pain,  Orthopnea, PND, swelling in lower extremities, anasarca, dizziness, palpitations, syncope.   GI  No heartburn, indigestion, abdominal pain, nausea, vomiting, diarrhea, change in bowel habits, loss of appetite, bloody stools.   Resp: + shortness of breath with exertion or at rest.  No excess mucus, no productive cough,  No non-productive cough,  No coughing up of blood.  No change in color of mucus.  No wheezing.  No chest wall deformity  Skin: no rash or lesions.  GU: no dysuria, change in color of urine, no  urgency or frequency.  No flank pain, no hematuria   MS:  No joint pain or swelling.  No decreased range of motion.  No back pain.  Psych:  No change in mood or affect. No depression or anxiety.  No memory loss.   Vital Signs BP 138/82 (BP Location: Left Wrist, Cuff Size: Normal)   Pulse 81   Ht 5\' 6"  (1.676 m)   Wt (!) 303 lb 12.8 oz (137.8 kg)   SpO2 96%   BMI 49.03 kg/m    Physical Exam:  General- No distress,  A&Ox3, obese ENT: No sinus tenderness, TM clear, pale nasal mucosa, no oral exudate,no post nasal drip, no LAN Cardiac: S1, S2, regular rate and rhythm, no murmur Chest: No wheeze/ rales/ dullness; no accessory muscle use, no nasal flaring, no sternal retractions Abd.: Soft Non-tender Ext: No clubbing cyanosis, edema Neuro:  normal strength Skin: No rashes, warm and dry Psych: anxious   Assessment/Plan  DOE (dyspnea on exertion) Dyspnea without hypoxia Afebrile Morbidly obese/ deconditioned Has not used Breo x 3 days as she ran out. Cannot afford medications.  Plan:  We will give you prescription for Breo 200.  Take one puff daily  every day without fail. We will give you a coupon for a free Breo for one month We will prescribe a short term inhaler for shortness of breath. You can take this as needed for shortness of breath up to every 6 hours. We will swab you for the flu. Your flu swab  was negative. We will give you a nebulizer treatment for your breathing today in the office. Drink plenty of Fluids and rest Follow up with your primary care doctor for any viral symptoms. Follow up with Dr. Christene Slatese Dios in 1 month. Please contact office for sooner follow up if symptoms do not improve or worsen or seek emergency care     Asthma Mild Exacerbation Plan: We will give you prescription for Breo 200.  Take one puff daily  every day without fail. We will give you a coupon for a free Breo for one month We will prescribe a short term inhaler for shortness of  breath. You can take this as needed for shortness of breath up to every 6 hours. We will swab you for the flu. Your flu swab  was negative. We will give you a nebulizer treatment for your breathing today in the office. Follow up with your primary care doctor for any viral symptoms. Follow up with Dr. Christene Slatese Dios in 1 month. Please contact office for sooner follow up if symptoms do not improve or worsen or seek emergency care        Gail NgoSarah F Loron Weimer, NP 09/08/2016  3:11 PM

## 2016-09-08 NOTE — Patient Instructions (Addendum)
We will give you prescription for Breo 200.  Take one puff daily  every day without fail. We will give you a coupon for a free Breo for one month We will prescribe a short term inhaler for shortness of breath. You can take this as needed for shortness of breath up to every 6 hours. We will swab you for the flu. Your flu swab  was negative. We will give you a nebulizer treatment for your breathing today in the office. Drink plenty of Fluids and rest Follow up with your primary care doctor for any viral symptoms. Follow up with Dr. Christene Slatese Dios in 1 month. Please contact office for sooner follow up if symptoms do not improve or worsen or seek emergency care

## 2016-09-08 NOTE — Assessment & Plan Note (Signed)
Mild Exacerbation Plan: We will give you prescription for Breo 200.  Take one puff daily  every day without fail. We will give you a coupon for a free Breo for one month We will prescribe a short term inhaler for shortness of breath. You can take this as needed for shortness of breath up to every 6 hours. We will swab you for the flu. Your flu swab  was negative. We will give you a nebulizer treatment for your breathing today in the office. Follow up with your primary care doctor for any viral symptoms. Follow up with Dr. Christene Slatese Dios in 1 month. Please contact office for sooner follow up if symptoms do not improve or worsen or seek emergency care

## 2016-09-08 NOTE — Assessment & Plan Note (Signed)
Dyspnea without hypoxia Afebrile Morbidly obese/ deconditioned Has not used Breo x 3 days as she ran out. Cannot afford medications.  Plan:  We will give you prescription for Breo 200.  Take one puff daily  every day without fail. We will give you a coupon for a free Breo for one month We will prescribe a short term inhaler for shortness of breath. You can take this as needed for shortness of breath up to every 6 hours. We will swab you for the flu. Your flu swab  was negative. We will give you a nebulizer treatment for your breathing today in the office. Drink plenty of Fluids and rest Follow up with your primary care doctor for any viral symptoms. Follow up with Gail Jordan in 1 month. Please contact office for sooner follow up if symptoms do not improve or worsen or seek emergency care

## 2016-10-08 ENCOUNTER — Ambulatory Visit (INDEPENDENT_AMBULATORY_CARE_PROVIDER_SITE_OTHER): Payer: Self-pay | Admitting: Adult Health

## 2016-10-08 ENCOUNTER — Other Ambulatory Visit (INDEPENDENT_AMBULATORY_CARE_PROVIDER_SITE_OTHER): Payer: Self-pay

## 2016-10-08 ENCOUNTER — Encounter: Payer: Self-pay | Admitting: Adult Health

## 2016-10-08 VITALS — BP 110/70 | HR 80 | Temp 97.7°F | Ht 66.0 in | Wt 305.0 lb

## 2016-10-08 DIAGNOSIS — G4733 Obstructive sleep apnea (adult) (pediatric): Secondary | ICD-10-CM

## 2016-10-08 DIAGNOSIS — J4520 Mild intermittent asthma, uncomplicated: Secondary | ICD-10-CM

## 2016-10-08 DIAGNOSIS — R0981 Nasal congestion: Secondary | ICD-10-CM

## 2016-10-08 DIAGNOSIS — R0609 Other forms of dyspnea: Secondary | ICD-10-CM

## 2016-10-08 DIAGNOSIS — J310 Chronic rhinitis: Secondary | ICD-10-CM | POA: Insufficient documentation

## 2016-10-08 LAB — CBC WITH DIFFERENTIAL/PLATELET
BASOS PCT: 0.4 % (ref 0.0–3.0)
Basophils Absolute: 0 10*3/uL (ref 0.0–0.1)
EOS PCT: 1.4 % (ref 0.0–5.0)
Eosinophils Absolute: 0.1 10*3/uL (ref 0.0–0.7)
HCT: 39.7 % (ref 36.0–46.0)
HEMOGLOBIN: 13.1 g/dL (ref 12.0–15.0)
LYMPHS ABS: 1.6 10*3/uL (ref 0.7–4.0)
Lymphocytes Relative: 28.6 % (ref 12.0–46.0)
MCHC: 33.1 g/dL (ref 30.0–36.0)
MCV: 83.2 fl (ref 78.0–100.0)
MONO ABS: 0.4 10*3/uL (ref 0.1–1.0)
Monocytes Relative: 7.9 % (ref 3.0–12.0)
Neutro Abs: 3.5 10*3/uL (ref 1.4–7.7)
Neutrophils Relative %: 61.7 % (ref 43.0–77.0)
Platelets: 272 10*3/uL (ref 150.0–400.0)
RBC: 4.78 Mil/uL (ref 3.87–5.11)
RDW: 14.7 % (ref 11.5–15.5)
WBC: 5.7 10*3/uL (ref 4.0–10.5)

## 2016-10-08 LAB — BASIC METABOLIC PANEL
BUN: 11 mg/dL (ref 6–23)
CALCIUM: 9.5 mg/dL (ref 8.4–10.5)
CO2: 30 mEq/L (ref 19–32)
CREATININE: 0.68 mg/dL (ref 0.40–1.20)
Chloride: 103 mEq/L (ref 96–112)
GFR: 116.96 mL/min (ref >60.00)
GLUCOSE: 116 mg/dL — AB (ref 70–99)
Potassium: 4.2 mEq/L (ref 3.5–5.1)
SODIUM: 140 meq/L (ref 135–145)

## 2016-10-08 LAB — BRAIN NATRIURETIC PEPTIDE: Pro B Natriuretic peptide (BNP): 30 pg/mL (ref 0.0–100.0)

## 2016-10-08 NOTE — Assessment & Plan Note (Signed)
CR vs sinusits  Add saline and flonase  Check CT sinus to r/o sinus infection .

## 2016-10-08 NOTE — Assessment & Plan Note (Addendum)
Needs to restart CPAP .At bedtime   [facility] .   Plan  Patient Instructions  Begin CPAP as soon as you can get.  Follow up Dr. Christene Slatese Dios in 6-8 weeks and As needed   Please contact office for sooner follow up if symptoms do not improve or worsen or seek emergency care

## 2016-10-08 NOTE — Assessment & Plan Note (Signed)
Possible asthma -improved symptom control on BREO  Will continue , hopefully can get rx drug assistance program.   Plan  Continue on BREO

## 2016-10-08 NOTE — Assessment & Plan Note (Signed)
Suspect this is mutlifactoral  CT chest was neg for PE and acute process in 05/2016  Check labs with [facility], bnp .  preivous PFT poor test - may need to repeat.

## 2016-10-08 NOTE — Patient Instructions (Signed)
Set up for a CT sinus.  Begin Saline nasal spray 2 puffs Twice daily  . his is over the counter  Begin Flonase Nasal spray 2 puffs Twice daily  -this is over the counter  Continue on BREO , paperwork for drug assistance .  Labs today .  Begin CPAP as soon as you can get.  Follow up Dr. Christene Slatese Dios in 6-8 weeks and As needed   Please contact office for sooner follow up if symptoms do not improve or worsen or seek emergency care

## 2016-10-08 NOTE — Progress Notes (Signed)
Subjective:    Patient ID: Gail Jordan, female    DOB: 1966/05/11, 50 y.o.   MRN: [id number]  HPI 50 yo obese female with severe OSA on CPAP .  Former pt of Dr. Shelle Ironlance   TEST  NPSG 11/2012:  AHI 61/hr with desat to 69%  10/08/2016 Follow up : OSA /Possible Asthma  Returns for one-month  follow-up for sleep apnea.and asthma  Pt is suppose to be CPAP but does not have insurance and no longer has CPAP .  She says she now has the money and is [facility] to pick up today.  She does have daytime sleepiness. And complains of snoring.Marland Kitchen.  She was seen last ov with sinus congestion and dyspnea. She was started on BREO . She says it has helped her breathing but still get winded with walking and has sinus congestion and pressure. .  Says her nose gets stopped up a lot . Feels this makes her breathing bad . She does not have insurance and will not be able to afford BREO we discussed pt assistance program.   She denies chest pain, orthopnea, edema or fever.     Past Medical History:  Diagnosis Date  . High cholesterol   . Hyperlipidemia   . Hypertension   . Obesity    Current Outpatient Prescriptions on File Prior to Visit  Medication Sig Dispense Refill  . albuterol (PROVENTIL HFA;VENTOLIN HFA) 108 (90 Base) MCG/ACT inhaler Inhale 2 puffs into the lungs every 6 (six) hours as needed for wheezing or shortness of breath. 1 Inhaler 6  . atorvastatin (LIPITOR) 20 MG tablet Take 20 mg by mouth daily.    . fluticasone furoate-vilanterol (BREO ELLIPTA) 200-25 MCG/INH AEPB Inhale 1 puff into the lungs daily. 1 each 6  . ibuprofen (ADVIL,MOTRIN) 200 MG tablet Take 200 mg by mouth every 6 (six) hours as needed for headache, mild pain or moderate pain.    Marland Kitchen. losartan (COZAAR) 50 MG tablet Take 50 mg by mouth daily.    . ondansetron (ZOFRAN ODT) 8 MG disintegrating tablet Take 1 tablet (8 mg total) by mouth every 8 (eight) hours as needed for nausea. 20 tablet 0  . HYDROcodone-acetaminophen  (NORCO/VICODIN) 5-325 MG per tablet Take 1 tablet by mouth every 6 (six) hours as needed. (Patient not taking: Reported on 10/08/2016) 8 tablet 0   No current facility-administered medications on file prior to visit.      Review of Systems .Constitutional:   No  weight loss, night sweats,  Fevers, chills,  +fatigue, or  Lassitude.+snoring   HEENT:   No headaches,  Difficulty swallowing,  Tooth/dental problems, or  Sore throat,                No sneezing, itching, ear ache, +nasal congestion, post nasal drip,   CV:  No chest pain,  Orthopnea, PND, swelling in lower extremities, anasarca, dizziness, palpitations, syncope.   GI  No heartburn, indigestion, abdominal pain, nausea, vomiting, diarrhea, change in bowel habits, loss of appetite, bloody stools.   Resp:  No chest wall deformity  Skin: no rash or lesions.  GU: no dysuria, change in color of urine, no urgency or frequency.  No flank pain, no hematuria   MS:  No joint pain or swelling.  No decreased range of motion.  No back pain.  Psych:  No change in mood or affect. No depression or anxiety.  No memory loss.         Objective:  Physical Exam Vitals:   10/08/16 1158  BP: 110/70  Pulse: 80  Temp: 97.7 F (36.5 C)  TempSrc: Oral  SpO2: 95%  Weight: (!) 305 lb (138.3 kg)  Height: 5\' 6"  (1.676 m)   Body mass index is 49.23 kg/m.  GEN: A/Ox3; pleasant , NAD, obese    HEENT:  [place]/AT,  EACs-clear, TMs-wnl, NOSE-clear drainage with nonspecific turbinate edema , THROAT-clear, no lesions, no postnasal drip or exudate noted. Class 2-3 MP airway   NECK:  Supple w/ fair ROM; no JVD; normal carotid impulses w/o bruits; no thyromegaly or nodules palpated; no lymphadenopathy.    RESP  Clear  P & A; w/o, wheezes/ rales/ or rhonchi. no accessory muscle use, no dullness to percussion  CARD:  RRR, no m/r/g  , tr peripheral edema, pulses intact, no cyanosis or clubbing.  GI:   Soft & nt; nml bowel sounds; no organomegaly or  masses detected.   Musco: Warm bil, no deformities or joint swelling noted.   Neuro: alert, no focal deficits noted.    Skin: Warm, no lesions or rashes  Florabelle Cardin NP-C  [facility]  10/08/2016        Assessment & Plan:

## 2016-10-14 ENCOUNTER — Inpatient Hospital Stay: Admission: RE | Admit: 2016-10-14 | Payer: Self-pay | Source: Ambulatory Visit

## 2016-10-15 ENCOUNTER — Ambulatory Visit (INDEPENDENT_AMBULATORY_CARE_PROVIDER_SITE_OTHER)
Admission: RE | Admit: 2016-10-15 | Discharge: 2016-10-15 | Disposition: A | Discharge status: Home / Self Care | Payer: Self-pay | Source: Ambulatory Visit | Attending: Adult Health | Admitting: Adult Health

## 2016-10-15 DIAGNOSIS — R0981 Nasal congestion: Secondary | ICD-10-CM

## 2016-10-25 ENCOUNTER — Ambulatory Visit (INDEPENDENT_AMBULATORY_CARE_PROVIDER_SITE_OTHER): Payer: Self-pay

## 2016-10-25 ENCOUNTER — Telehealth: Payer: Self-pay | Admitting: Pulmonary Disease

## 2016-10-25 ENCOUNTER — Ambulatory Visit: Payer: Self-pay

## 2016-10-25 DIAGNOSIS — Z111 Encounter for screening for respiratory tuberculosis: Secondary | ICD-10-CM

## 2016-10-25 MED ORDER — FLUTICASONE FUROATE-VILANTEROL 100-25 MCG/INH IN AEPB: 1.0000 | INHALATION_SPRAY | Freq: Every day | RESPIRATORY_TRACT | 0 refills | Status: DC

## 2016-10-25 NOTE — Telephone Encounter (Signed)
Patient still in the lobby Went to speak with patient - she [facility] that [facility] and [facility].  When discussed with patient that we currently do not have samples of the Breo 200.  Patient stated that she is having increased SOB without the Breo.  She is also asking if she can have a TB skin test for her employer and mentioned that Allergy Injections were discussed with her at one point and she would like to revisit this.  MW with opening this afternoon at 4.30pm but pt declined this Advised pt will discuss with TP when she starts afternoon clinic - pt wants to wait in the lobby  TP please advise, thank you Per 12.1.17 visit: Patient Instructions  Set up for a CT sinus.  Begin Saline nasal spray 2 puffs Twice daily  . his is over the counter  Begin Flonase Nasal spray 2 puffs Twice daily  -this is over the counter  Continue on BREO , paperwork for drug assistance .  Labs today .  Begin CPAP as soon as you can get.  Follow up Dr. Christene Slatese Dios in 6-8 weeks and As needed   Please contact office for sooner follow up if symptoms do not improve or worsen or seek emergency care

## 2016-10-25 NOTE — Telephone Encounter (Signed)
Spoke with pt and gave her TP message. Nothing further is needed at this time.

## 2016-10-25 NOTE — Addendum Note (Signed)
Addended by: Trudi IdaMABRY, JASMINE L on: 10/25/2016 03:33 PM   Modules accepted: Orders

## 2016-10-25 NOTE — Telephone Encounter (Signed)
We do not have samples of the Breo 200

## 2016-10-25 NOTE — Progress Notes (Addendum)
Please see previous phone messages.  Pt. Had a TB placement today on her left arm. Pt. Denies being out of the country the past three months,denies fever,chest pain,night sweats, has not been around someone that has TB recently, no unexplained weight changes. Pt. 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   Per TP she verbalized that pt. Could have Breo 100 samples. Samples were given to the pt.   Julio Sicksammy S Parrett, NP      10/25/16 2:42 PM  Note    Pt declined ov.  Can have sample of BREO .  Can do TB skin test per protocol.  Will need to discuss allergy testing on return ov.  Please contact office for sooner follow up if symptoms do not improve or worsen or seek emergency care

## 2016-10-25 NOTE — Telephone Encounter (Signed)
Pt declined ov.  Can have sample of BREO .  Can do TB skin test per protocol.  Will need to discuss allergy testing on return ov.  Please contact office for sooner follow up if symptoms do not improve or worsen or seek emergency care

## 2016-10-27 ENCOUNTER — Ambulatory Visit (INDEPENDENT_AMBULATORY_CARE_PROVIDER_SITE_OTHER)
Admission: RE | Admit: 2016-10-27 | Discharge: 2016-10-27 | Disposition: A | Discharge status: Home / Self Care | Payer: Self-pay | Source: Ambulatory Visit | Attending: Pulmonary Disease | Admitting: Pulmonary Disease

## 2016-10-27 ENCOUNTER — Telehealth: Payer: Self-pay | Admitting: Pulmonary Disease

## 2016-10-27 DIAGNOSIS — R7611 Nonspecific reaction to tuberculin skin test without active tuberculosis: Secondary | ICD-10-CM

## 2016-10-27 LAB — TB SKIN TEST
Induration: 20 mm
TB SKIN TEST: POSITIVE

## 2016-10-27 NOTE — Telephone Encounter (Deleted)
Will send to AD to address when he returns to office.

## 2016-10-27 NOTE — Telephone Encounter (Addendum)
Pt came in have ppd read, which was abnormal. Infected area was 20mm on flare wheel, 0.5" wide and 1" long. 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 TP ok'd this test on Monday. I spoke with RA and gave him results. Dr. Vassie LollAlva recommended that pt have a cxr and come back upstairs afterwards for results. Pt is also requesting a letter be sent to her school, RA agreed to this once CXR is reviewed.  Letter has been printed, signed and given to pt.  Will route to AD to f/u on when back in office.

## 2016-10-29 NOTE — Progress Notes (Signed)
Pt has Feb 2018 appt with AD

## 2016-11-09 NOTE — Telephone Encounter (Signed)
AD  Please advise-   You are completely booked out until Feb., is it ok to overbook you since you would like to see her sooner? Also did you want her to come in the morning or afternoon if we do overbook you.

## 2016-11-09 NOTE — Telephone Encounter (Signed)
   I was able to finally speak with the pt.  She is from [place] and allegedly a lot of people are PPD (+).  She told me 17 yrs ago (when she came to the [place]), she was PPD (+).  She added that they repeated PPD 5 yrs ago and was (-) ???.  It was hard to understand pt over the phone.    She denies PTB. (-) fevers, cough, chills, SOB. She feels fine.  Jasmine > pt has a f/u on 2/5.  Do you think she can be seen sooner than that to discuss her (+) PPD? Thanks.  Pollie MeyerJ. Angelo A de Dios, MD 11/09/2016, 3:11 PM [facility] Pager [phone] After 3 pm or if no answer, call [phone]

## 2016-11-10 NOTE — Telephone Encounter (Signed)
Just overbook me in the morning.  I can see her next week > maybe Tuesday next week ?  J. Alexis FrockAngelo A de Dios, MD 11/10/2016, 1:41 PM [facility] Pager [phone] After 3 pm or if no answer, call [phone]

## 2016-11-11 NOTE — Telephone Encounter (Signed)
Spoke with pt. And gave her AD message, she agreed to come in at 10:15 am next tues. Nothing further is needed at this time.

## 2016-11-16 ENCOUNTER — Encounter: Payer: Self-pay | Admitting: Pulmonary Disease

## 2016-11-16 ENCOUNTER — Ambulatory Visit (INDEPENDENT_AMBULATORY_CARE_PROVIDER_SITE_OTHER): Payer: Self-pay | Admitting: Pulmonary Disease

## 2016-11-16 DIAGNOSIS — K219 Gastro-esophageal reflux disease without esophagitis: Secondary | ICD-10-CM

## 2016-11-16 DIAGNOSIS — J4520 Mild intermittent asthma, uncomplicated: Secondary | ICD-10-CM

## 2016-11-16 DIAGNOSIS — G4733 Obstructive sleep apnea (adult) (pediatric): Secondary | ICD-10-CM

## 2016-11-16 DIAGNOSIS — R7611 Nonspecific reaction to tuberculin skin test without active tuberculosis: Secondary | ICD-10-CM | POA: Insufficient documentation

## 2016-11-16 MED ORDER — FLUTICASONE FUROATE-VILANTEROL 200-25 MCG/INH IN AEPB: 1.0000 | INHALATION_SPRAY | Freq: Every day | RESPIRATORY_TRACT | 0 refills | Status: DC

## 2016-11-16 NOTE — Progress Notes (Signed)
Patient seen in the office today and instructed on use of Breo 200.  Patient expressed understanding and demonstrated technique.  

## 2016-11-16 NOTE — Assessment & Plan Note (Signed)
SOB likely related to obesity.  Questionable history of asthma. I doubt. Occasional SOB and wheeze with change in weather.  Will need PFT and ABG once with insurance.

## 2016-11-16 NOTE — Assessment & Plan Note (Addendum)
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  She had PPD placed in December 2017 and it was (+). Pt is originally from [place], she got "BCG" I think as a baby. She had a [place]PD in [place] in 2001 or 2002 when she got here. It was (-) allegedly. It was repeated in 2005 in [place] when she was applying for a CNA job.  PPD was (+) then. No chemoprophylaxis given then. She is currently asymptomatic.  (-) fevers, chills, cough. CXR (10/2016) > unremarkable.   I extensively discussed with her the implications of positive PPD. 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 At that point, plan to get Quantiferon GOLD. If (-), nothing to do.  If (+)< I will recommend chemoprophylaxis.

## 2016-11-16 NOTE — Addendum Note (Signed)
Addended by: Pamalee LeydenWIGGINS, Brynja Marker J on: 11/16/2016 11:10 AM   Modules accepted: Orders

## 2016-11-16 NOTE — Assessment & Plan Note (Signed)
Weight reduction 

## 2016-11-16 NOTE — Assessment & Plan Note (Signed)
PSG 11/2012:  AHI 61/hr with desat to 69% Patient was on CPAP in 2016 but was taken away from her as she had ObamaCare and her DME then was not in network. Very symptomatic now. Has hypersomnia, snoring, fatigue, witnessed apnea, gasping, choking.  We extensively discussed the diagnosis, pathophysiology, and treatment options for Obstructive Sleep Apnea (OSA).  We discussed treatment options for OSA including CPAP, BiPaP, as well as surgical options and oral devices.   Unfortunately, patient does not have medical insurance.  We applied  for an auto CPAP machine through CPAP assistance program from the [employer] when I saw her the last time. We filled out the paperwork and faxed it to the company.   Currently, patient is trying to come up with $100 to pay [employer] for her CPAP machine. Once she has the money, she'll let us know. At that time, she will need a 4-6 week f/u once with cpap.

## 2016-11-16 NOTE — Patient Instructions (Signed)
It was a pleasure taking care of you today!  Please let us know once you obtain your CPAP machine. 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   Return to clinic as needed.

## 2016-11-16 NOTE — Assessment & Plan Note (Signed)
Diet chnages.

## 2016-11-16 NOTE — Progress Notes (Signed)
Subjective:    Patient ID: Gail Jordan, female    DOB: 01/12/1966, 51 y.o.   MRN: [id number]  HPI Patient is being seen for severe sleep apnea AND sob.   ROV 07/05/16  patient was seen previously by Dr. Shelle Iron and was seen by Tammy Parrett in 03/2016. She has severe sleep apnea with an AHI of 61and a study in 2014. She had a cpap machine in 2016 but DME took it away from her in 12/2015 as she was out of network. Patient also with exertional dyspnea, occasional wheezing. Denies any allergies. Usually the dyspnea is associated with change in weather.  ROV 09/01/16 Patient returns to the office as follow-up on her sleep apnea. Since last seen, she has not done her part as far as paying for her  CPAP machine which we applied with her when she was here last time. We told her to pay online but she did not do it. She said she does not have computer Internet. She tried calling the company but it was only a Engineer, technical sales. She has also not applied for insurance.   She  remains to have symptoms of sleep apnea.   ROV  11/16/2016 Patient returns to the office as follow-up on her recent finding of being PPD (+). I was seeing her for OSA but pt still has not come up with the money ($100) to pay for cpap. 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  She had PPD placed in December 2017 and it was (+). Pt is originally from [place], she got "BCG" I think as a baby. She had a PPD in [place] in 2001 or 2002 when she got here. It was (-) allegedly. 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  PPD was (+) then. She is asymptomatic.  (-) fevers, chills, cough.    Review of Systems  Constitutional: Negative.   HENT: Negative.   Eyes: Negative.   Respiratory: Positive for shortness of breath.   Cardiovascular: Negative.   Gastrointestinal: Negative.   Endocrine: Negative.   Genitourinary: Negative.   Musculoskeletal: Negative.   Skin: Negative.   Allergic/Immunologic: Negative.     Neurological: Negative.   Hematological: Negative.   Psychiatric/Behavioral: Negative.   All other systems reviewed and are negative.      Objective:   Physical Exam  Vitals:  Vitals:   11/16/16 1024  BP: 140/90  Pulse: 82  SpO2: 96%  Weight: (!) 310 lb (140.6 kg)  Height: 5\' 6"  (1.676 m)    Constitutional/General:  Pleasant, well-nourished, well-developed, not in any distress,  Comfortably seating.  Well kempt  Body mass index is 50.04 kg/m. Wt Readings from Last 3 Encounters:  11/16/16 (!) 310 lb (140.6 kg)  10/08/16 (!) 305 lb (138.3 kg)  09/08/16 (!) 303 lb 12.8 oz (137.8 kg)    HEENT: Pupils equal and reactive to light and accommodation. Anicteric sclerae. Normal nasal mucosa.   No oral  lesions,  mouth clear,  oropharynx clear, no postnasal drip. (-) Oral thrush. No dental caries.  Airway - Mallampati class IV  Neck: No masses. Midline trachea. No JVD, (-) LAD. (-) bruits appreciated.  Respiratory/Chest: Grossly normal chest. (-) deformity. (-) Accessory muscle use.  Symmetric expansion. (-) Tenderness on palpation.  Resonant on percussion.  Diminished BS on both lower lung zones. (-) wheezing, crackles, rhonchi (-) egophony  Cardiovascular: Regular rate and  rhythm, heart sounds normal, no murmur  or gallops, no peripheral edema  Gastrointestinal:  Normal bowel sounds. Soft, non-tender. No hepatosplenomegaly.  (-) masses.   Musculoskeletal:  Normal muscle tone. Normal gait.   Extremities: Grossly normal. (-) clubbing, cyanosis.  (-) edema  Skin: (-) rash,lesions seen.   Neurological/Psychiatric : alert, oriented to time, place, person. Normal mood and affect          Assessment & Plan:  OSA (obstructive sleep apnea) PSG 11/2012:  AHI 61/hr with desat to 69% Patient was on CPAP in 2016 but was taken away from her as she had ObamaCare and her DME then was not in network. Very symptomatic now. Has hypersomnia, snoring, fatigue, witnessed  apnea, gasping, choking.  We extensively discussed the diagnosis, pathophysiology, and treatment options for Obstructive Sleep Apnea (OSA).  We discussed treatment options for OSA including CPAP, BiPaP, as well as surgical options and oral devices.   Unfortunately, patient does not have medical insurance.  We applied  for an auto CPAP machine through CPAP assistance program from the [employer] when I saw her the last time. We filled out the paperwork and faxed it to the company.   Currently, patient is trying to come up with $100 to pay [employer] for her CPAP machine. Once she has the money, she'll let us know. At that time, she will need a 4-6 week f/u once with cpap.       Asthma SOB likely related to obesity.  Questionable history of asthma. I doubt. Occasional SOB and wheeze with change in weather.  Will need PFT and ABG once with insurance.   Obesity, morbid (HCC) Weight reduction  GERD (gastroesophageal reflux disease) Diet chnages.   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  She had PPD placed in December 2017 and it was (+). Pt is originally from [place], she got "BCG" I think as a baby. She had a [place]PD in [place] in 2001 or 2002 when she got here. It was (-) allegedly. It was repeated in 2005 in [place] when she was applying for a CNA job.  PPD was (+) then. No chemoprophylaxis given then. She is currently asymptomatic.  (-) fevers, chills, cough. CXR (10/2016) > unremarkable.   I extensively discussed with her the implications of positive PPD. 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 At that point, plan to get Quantiferon GOLD. If (-), nothing to do.  If (+)< I will recommend chemoprophylaxis.   RTC once she has her CPAP machine.    Pollie MeyerJ. Angelo A de Dios, MD 11/16/2016, 11:04 AM [facility] Pager [phone] After 3 pm  or if no answer, call [phone]   v

## 2016-11-18 ENCOUNTER — Ambulatory Visit: Payer: Self-pay | Admitting: Pulmonary Disease

## 2016-12-13 ENCOUNTER — Ambulatory Visit: Payer: Self-pay | Admitting: Pulmonary Disease

## 2016-12-16 ENCOUNTER — Telehealth: Payer: Self-pay | Admitting: Pulmonary Disease

## 2016-12-16 NOTE — Telephone Encounter (Signed)
Samples have been given to the pt. Nothing further was needed. 

## 2016-12-22 ENCOUNTER — Telehealth: Payer: Self-pay | Admitting: Pulmonary Disease

## 2016-12-22 NOTE — Telephone Encounter (Signed)
Spoke with pt, states she lost her cpap assistance application forms and is requesting a new form.  A new form has been given to patient.  Nothing further needed.

## 2017-01-07 ENCOUNTER — Ambulatory Visit: Payer: Self-pay | Admitting: Pulmonary Disease

## 2017-01-13 ENCOUNTER — Ambulatory Visit: Payer: Self-pay | Admitting: Pulmonary Disease

## 2017-01-13 ENCOUNTER — Other Ambulatory Visit: Payer: Self-pay

## 2017-01-13 DIAGNOSIS — G4733 Obstructive sleep apnea (adult) (pediatric): Secondary | ICD-10-CM

## 2017-01-13 MED ORDER — FLUTICASONE FUROATE-VILANTEROL 200-25 MCG/INH IN AEPB: 1.0000 | INHALATION_SPRAY | Freq: Every day | RESPIRATORY_TRACT | 0 refills | Status: DC

## 2017-01-18 ENCOUNTER — Telehealth: Payer: Self-pay | Admitting: Pulmonary Disease

## 2017-01-18 DIAGNOSIS — G4733 Obstructive sleep apnea (adult) (pediatric): Secondary | ICD-10-CM

## 2017-01-18 NOTE — Telephone Encounter (Signed)
[facility] is needing pressure settings for pt's CPAP machine. This was not placed in the original order, nor was it placed on the CPAP template.  AD - please clarify what pressure setting the pt's CPAP machine should be set at. Thanks.

## 2017-01-18 NOTE — Telephone Encounter (Signed)
Can we try autocpap 5-15 cm water?  Thanks. pls get a 1 month DL as well.   Pollie MeyerJ. Angelo A de Dios, MD 01/18/2017, 11:24 PM [facility] Pager [phone] After 3 pm or if no answer, call [phone]

## 2017-01-19 NOTE — Telephone Encounter (Signed)
Order has been placed per AD. Nothing further was needed.

## 2017-03-04 ENCOUNTER — Encounter (HOSPITAL_COMMUNITY): Payer: Self-pay

## 2017-03-04 ENCOUNTER — Emergency Department (HOSPITAL_COMMUNITY): Payer: Self-pay

## 2017-03-04 ENCOUNTER — Emergency Department (HOSPITAL_COMMUNITY)
Admission: EM | Admit: 2017-03-04 | Discharge: 2017-03-05 | Disposition: A | Discharge status: Home / Self Care | Payer: Self-pay | Attending: Emergency Medicine | Admitting: Emergency Medicine

## 2017-03-04 DIAGNOSIS — I10 Essential (primary) hypertension: Secondary | ICD-10-CM | POA: Insufficient documentation

## 2017-03-04 DIAGNOSIS — Z87891 Personal history of nicotine dependence: Secondary | ICD-10-CM | POA: Insufficient documentation

## 2017-03-04 DIAGNOSIS — F410 Panic disorder [episodic paroxysmal anxiety] without agoraphobia: Secondary | ICD-10-CM

## 2017-03-04 DIAGNOSIS — Z79899 Other long term (current) drug therapy: Secondary | ICD-10-CM | POA: Insufficient documentation

## 2017-03-04 DIAGNOSIS — R0789 Other chest pain: Secondary | ICD-10-CM

## 2017-03-04 DIAGNOSIS — J45909 Unspecified asthma, uncomplicated: Secondary | ICD-10-CM | POA: Insufficient documentation

## 2017-03-04 HISTORY — DX: Sleep apnea, unspecified: G47.30

## 2017-03-04 LAB — BRAIN NATRIURETIC PEPTIDE: B Natriuretic Peptide: 23.4 pg/mL (ref 0.0–100.0)

## 2017-03-04 LAB — I-STAT TROPONIN, ED
TROPONIN I, POC: 0 ng/mL (ref 0.00–0.08)
Troponin i, poc: 0 ng/mL (ref 0.00–0.08)

## 2017-03-04 LAB — BASIC METABOLIC PANEL
Anion gap: 7 (ref 5–15)
BUN: 13 mg/dL (ref 6–20)
CHLORIDE: 106 mmol/L (ref 101–111)
CO2: 27 mmol/L (ref 22–32)
CREATININE: 0.77 mg/dL (ref 0.44–1.00)
Calcium: 9.3 mg/dL (ref 8.9–10.3)
GFR calc Af Amer: >60 mL/min (ref >60)
GFR calc non Af Amer: >60 mL/min (ref >60)
GLUCOSE: 188 mg/dL — AB (ref 65–99)
Potassium: 3.9 mmol/L (ref 3.5–5.1)
SODIUM: 140 mmol/L (ref 135–145)

## 2017-03-04 LAB — CBC
HCT: 38.6 % (ref 36.0–46.0)
HEMOGLOBIN: 12.5 g/dL (ref 12.0–15.0)
MCH: 27.5 pg (ref 26.0–34.0)
MCHC: 32.4 g/dL (ref 30.0–36.0)
MCV: 85 fL (ref 78.0–100.0)
Platelets: 218 10*3/uL (ref 150–400)
RBC: 4.54 MIL/uL (ref 3.87–5.11)
RDW: 14.3 % (ref 11.5–15.5)
WBC: 5.8 10*3/uL (ref 4.0–10.5)

## 2017-03-04 MED ORDER — ALBUTEROL SULFATE HFA 108 (90 BASE) MCG/ACT IN AERS
2.0000 | INHALATION_SPRAY | RESPIRATORY_TRACT | Status: DC | PRN
  Administered 2017-03-04: 2 via RESPIRATORY_TRACT
  Filled 2017-03-04: qty 6.7

## 2017-03-04 MED ORDER — OXYCODONE-ACETAMINOPHEN 5-325 MG PO TABS
2.0000 | ORAL_TABLET | Freq: Once | ORAL | Status: AC
  Administered 2017-03-04: 2 via ORAL
  Filled 2017-03-04: qty 2

## 2017-03-04 NOTE — ED Provider Notes (Signed)
[facility]-EMERGENCY DEPT Provider Note   CSN: [id number] Arrival date & time: 03/04/17  1802     History   Chief Complaint Chief Complaint  Patient presents with  . Chest Pain  . Shortness of Breath    HPI Gail Jordan is a 51 y.o. female.  Patient with a history of hypertension, hyperlipidemia, obesity and sleep apnea presents with shortness of breath and anxiety. She states that she was driving to work normally and was feeling fine but then got a call that she was supposed to show up on a different shift. She states that this made her very anxious and she became short of breath. She denies any actual wheezing. She denies any chest pain. She states this is happened to her before. She states that she doesn't handle changes in her schedule very well. She has some pain in her lower legs which she says is from working out but she denies any other leg tenderness. She denies any increased swelling to her legs. No fevers or coughing. She has a little bit of nasal congestion from her recent cold. She denies any fevers.      Past Medical History:  Diagnosis Date  . High cholesterol   . Hyperlipidemia   . Hypertension   . Obesity   . Sleep apnea     Patient Active Problem List   Diagnosis Date Noted  . 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 Chronic rhinitis 10/08/2016  . Asthma 07/05/2016  . Obesity, morbid (HCC) 03/09/2016  . DOE (dyspnea on exertion) 09/03/2014  . Hypertension 01/04/2013  . Dyslipidemia 01/04/2013  . GERD (gastroesophageal reflux disease) 01/04/2013  . OSA (obstructive sleep apnea) 10/27/2012    Past Surgical History:  Procedure Laterality Date  . RETAINED PLACENTA REMOVAL     after child birth  . STOMACH SURGERY     possibly???    OB History    Gravida Para Term Preterm AB Living   0 3 2   SAB TAB Ectopic Multiple Live Births   0 3 0 0         Home Medications    Prior to Admission medications   Medication Sig Start Date End Date Taking?  Authorizing Provider  acetaminophen (TYLENOL) 500 MG tablet Take 500 mg by mouth every 6 (six) hours as needed for mild pain, moderate pain, fever or headache.    Yes Historical Provider, MD  atorvastatin (LIPITOR) 20 MG tablet Take 20 mg by mouth daily.   Yes Historical Provider, MD  losartan (COZAAR) 50 MG tablet Take 50 mg by mouth daily.   Yes Historical Provider, MD  fluticasone furoate-vilanterol (BREO ELLIPTA) 200-25 MCG/INH AEPB Inhale 1 puff into the lungs daily. Patient not taking: Reported on 03/04/2017 01/13/17   Louann Sjogren, MD    Family History Family History  Problem Relation Age of Onset  . Hypertension Father   . Hypertension Sister     Social History Social History  Substance Use Topics  . Smoking status: Former Smoker    Years: 2.00    Types: Cigarettes    Quit date: 11/08/2010  . Smokeless tobacco: Never Used     Comment: 1 pack per 1-2 weeks.   . Alcohol use Yes     Comment: once a month--wine     Allergies   Antihistamines, diphenhydramine-type   Review of Systems Review of Systems  Constitutional: Positive for fatigue. Negative for chills, diaphoresis and fever.  HENT: Negative for congestion,  rhinorrhea and sneezing.   Eyes: Negative.   Respiratory: Positive for shortness of breath. Negative for cough and chest tightness.   Cardiovascular: Negative for chest pain and leg swelling.  Gastrointestinal: Negative for abdominal pain, blood in stool, diarrhea, nausea and vomiting.  Genitourinary: Negative for difficulty urinating, flank pain, frequency and hematuria.  Musculoskeletal: Positive for myalgias. Negative for arthralgias and back pain.  Skin: Negative for rash.  Neurological: Negative for dizziness, speech difficulty, weakness, numbness and headaches.  Psychiatric/Behavioral: The patient is nervous/anxious.      Physical Exam Updated Vital Signs BP (!) 148/80 (BP Location: Left Arm)   Pulse 79   Temp 98 F (36.7 C) (Oral)   Resp  20   Ht  (1.676 m)   Wt 275 lb (124.7 kg)   SpO2 95%   BMI 44.39 kg/m   Physical Exam  Constitutional: She is oriented to person, place, and time. She appears well-developed and well-nourished.  HENT:  Head: Normocephalic and atraumatic.  Eyes: Pupils are equal, round, and reactive to light.  Neck: Normal range of motion. Neck supple.  Cardiovascular: Normal rate, regular rhythm and normal heart sounds.   Pulmonary/Chest: Effort normal and breath sounds normal. No respiratory distress. She has no wheezes. She has no rales. She exhibits no tenderness.  Abdominal: Soft. Bowel sounds are normal. There is no tenderness. There is no rebound and no guarding.  Musculoskeletal: Normal range of motion. She exhibits edema (1+ pitting edema bilaterally).  Lymphadenopathy:    She has no cervical adenopathy.  Neurological: She is alert and oriented to person, place, and time.  Skin: Skin is warm and dry. No rash noted.  Psychiatric: She has a normal mood and affect.     ED Treatments / Results  Labs (all labs ordered are listed, but only abnormal results are displayed) Labs Reviewed  BASIC METABOLIC PANEL - Abnormal; Notable for the following:       Result Value   Glucose, Bld 188 (*)    All other components within normal limits  CBC  BRAIN NATRIURETIC PEPTIDE  I-STAT TROPOININ, ED  I-STAT TROPOININ, ED    EKG  EKG Interpretation  Date/Time:  Friday March 04 2017 18:25:16 EDT Ventricular Rate:  86 PR Interval:    QRS Duration: 96 QT Interval:  368 QTC Calculation: 441 R Axis:   13 Text Interpretation:  Sinus rhythm Baseline wander in lead(s) V1 since last tracing no significant change Confirmed by Sherlyne Crownover  MD, Brissa Asante ([id number]) on 03/04/2017 6:49:58 PM       Radiology Dg Chest 2 View  Result Date: 03/04/2017 CLINICAL DATA:  Dyspnea and hypertension EXAM: CHEST  2 VIEW COMPARISON:  10/27/2016 FINDINGS: Borderline cardiomegaly with aortic atherosclerosis. Mild vascular  congestion without pneumonic consolidation, effusion or pneumothorax. No acute or suspicious osseous lesions. IMPRESSION: Borderline cardiomegaly with aortic atherosclerosis. Mild vascular congestion. Electronically Signed   By: Tollie Eth M.D.   On: 03/04/2017 18:38    Procedures Procedures (including critical care time)  Medications Ordered in ED Medications  albuterol (PROVENTIL HFA;VENTOLIN HFA) 108 (90 Base) MCG/ACT inhaler 2 puff (2 puffs Inhalation Given 03/04/17 2254)  oxyCODONE-acetaminophen (PERCOCET/ROXICET) 5-325 MG per tablet 2 tablet (2 tablets Oral Given 03/04/17 2246)     Initial Impression / Assessment and Plan / ED Course  I have reviewed the triage vital signs and the nursing notes.  Pertinent labs & imaging results that were available during my care of the patient were reviewed by me and considered  in my medical decision making (see chart for details).     Patient presents with symptoms that sound consistent with a panic attack. She's feeling much better on arrival to the ED. Her lungs are clear. She's maintaining normal oxygen saturations. Her EKG doesn't show ischemic changes. She does have some reproducible tenderness over her sternum but no other chest pain. She's had delta troponin which are negative. She has no suggestions of DVT. She was discharged home in good condition. She was encouraged to follow-up with her PCP. Return precautions were given.  Final Clinical Impressions(s) / ED Diagnoses   Final diagnoses:  Anxiety attack  Chest wall pain    New Prescriptions New Prescriptions   No medications on file     Rolan Bucco, MD 03/04/17 [phone]

## 2017-03-04 NOTE — ED Notes (Signed)
Patient transported to X-ray 

## 2017-03-04 NOTE — ED Notes (Signed)
MD in room

## 2017-03-04 NOTE — ED Notes (Addendum)
Pt found alert and oriented x 4 verbally responsive. Pt states that her whole body hurts, and reports having chills, and fatigue. Pt was found on 2 lpm of oxygen via [place]. Pt O2 saturation 98%. Pt is c/o of nasal itching and congestion, and complains of SOB, pt states that she uses cpap at night sometimes. Pt appears clam and no SOB is noted at this time.

## 2017-03-04 NOTE — ED Notes (Signed)
RN starting line and collecting blood work 

## 2017-03-04 NOTE — ED Notes (Signed)
Attempted to get IV, was unsuccessful

## 2017-03-04 NOTE — ED Triage Notes (Addendum)
PT RECEIVED FROM HOME VIA EMS FOR A PANIC ATTACK. PER EMS, PT WAS SENT HOME FROM WORK EARLY, AND SHE TOOK NAP, AND WHEN SHE WOKE UP SHE STARTED HAVING A PANIC ATTACK AND SOB. ONCE SHE WAS IN THE EMS TRUCK, SHE BEGAN TO CALM DOWN AND SOB RESOLVED.

## 2017-03-04 NOTE — ED Notes (Signed)
Bed: [facility] Expected date:  Expected time:  Means of arrival:  Comments: 

## 2017-03-08 ENCOUNTER — Ambulatory Visit (INDEPENDENT_AMBULATORY_CARE_PROVIDER_SITE_OTHER): Payer: Self-pay | Admitting: Internal Medicine

## 2017-03-08 ENCOUNTER — Telehealth: Payer: Self-pay | Admitting: Pulmonary Disease

## 2017-03-08 ENCOUNTER — Encounter: Payer: Self-pay | Admitting: Internal Medicine

## 2017-03-08 VITALS — BP 128/68 | HR 91 | Wt 313.6 lb

## 2017-03-08 DIAGNOSIS — R0609 Other forms of dyspnea: Secondary | ICD-10-CM

## 2017-03-08 MED ORDER — FLUTICASONE FUROATE-VILANTEROL 200-25 MCG/INH IN AEPB: 1.0000 | INHALATION_SPRAY | Freq: Every day | RESPIRATORY_TRACT | 0 refills | Status: DC

## 2017-03-08 NOTE — Progress Notes (Signed)
Subjective:     Patient ID: Gail Jordan, female   DOB: [birth year],   MRN: [id number]  HPI  71 yobf  Quit smoking 2012 with doe eval by [facility] in 2015 with dx obesity/ deconditioning seen in ER 03/04/17 with panic attack and not better so walked into lobby requesting samples of breo (see ov 09/08/16)    03/08/2017 1st  office visit/ [facility]  Up  Chief Complaint  Patient presents with  . Shortness of Breath    ACUTE VISIT: AD patient. Pt walked in c/o SOB, wheezing, and dizzy.   acute onset sob x 5 days just as bad at rest as walking/  s assoc cough or wheeze  thinks it's because she's not been able to purchase Breo x one month off it   No obvious day to day or daytime variability or assoc excess/ purulent sputum or mucus plugs or hemoptysis or cp or chest tightness, subjective wheeze or overt sinus or hb symptoms. No unusual exp hx or h/o childhood pna/ asthma or knowledge of premature birth.  Sleeping ok on cpap  without nocturnal  or early am exacerbation  of respiratory  c/o's or need for noct saba. Also denies any obvious fluctuation of symptoms with weather or environmental changes or other aggravating or alleviating factors except as outlined above   Current Medications, Allergies, Complete Past Medical History, Past Surgical History, Family History, and Social History were reviewed in [employer] record.  ROS  The following are not active complaints unless bolded sore throat, dysphagia, dental problems, itching, sneezing,  nasal congestion or excess/ purulent secretions, ear ache,   fever, chills, sweats, unintended wt loss, classically pleuritic or exertional cp,  orthopnea pnd or leg swelling (better vs baseline) , presyncope, palpitations, abdominal pain, anorexia, nausea, vomiting, diarrhea  or change in bowel or bladder habits, change in stools or urine, dysuria,hematuria,  rash, arthralgias, visual complaints, headache, numbness, weakness or ataxia or problems  with walking or coordination,  change in mood/affect- anxious about finances/job  or memory.          Review of Systems     Objective:   Physical Exam     anxious massively obese bf resting tachypnea  Wt Readings from Last 3 Encounters:  03/08/17 (!) 313 lb 9.6 oz (142.2 kg)  03/04/17 275 lb (124.7 kg)  11/16/16 (!) 310 lb (140.6 kg)    Vital signs reviewed - Note on arrival 02 sats  100% on RA   HEENT: nl dentition, turbinates bilaterally, and oropharynx. Nl external ear canals without cough reflex   NECK :  without JVD/Nodes/TM/ nl carotid upstrokes bilaterally   LUNGS: no acc muscle use,  Nl contour chest which is clear to A and P bilaterally without cough on insp or exp maneuvers   CV:  RRR  no s3 or murmur or increase in P2, and only trace bilateral ankle edema   ABD:  massivel obese/soft and nontender with nl inspiratory excursion in the supine position. No bruits or organomegaly appreciated, bowel sounds nl  MS:  Nl gait/ ext warm without deformities, calf tenderness, cyanosis or clubbing No obvious joint restrictions   SKIN: warm and dry without lesions    NEURO:  alert, approp, nl sensorium with  no motor or cerebellar deficits apparent.     Labs  reviewed:      Chemistry      Component Value Date/Time   NA 140 03/04/2017 1945   K 3.9 03/04/2017 1945  CL 106 03/04/2017 1945   CO2 27 03/04/2017 1945   BUN 13 03/04/2017 1945   CREATININE 0.77 03/04/2017 1945      Component Value Date/Time   CALCIUM 9.3 03/04/2017 1945   ALKPHOS 48 05/27/2015 1755   AST 38 05/27/2015 1755   ALT 34 05/27/2015 1755   BILITOT 0.8 05/27/2015 1755        Lab Results  Component Value Date   WBC 5.8 03/04/2017   HGB 12.5 03/04/2017   HCT 38.6 03/04/2017   MCV 85.0 03/04/2017   PLT 218 03/04/2017        Lab Results  Component Value Date   PROBNP 30.0 10/08/2016             I personally reviewed images and agree with radiology impression as follows:   CXR:   03/04/17 Borderline cardiomegaly with aortic atherosclerosis. Mild vascular congestion.       Assessment:

## 2017-03-08 NOTE — Assessment & Plan Note (Addendum)
PFT's 09/2014:  Poor understanding of technique, no consistent trials, DLCO very inaccurate, ??+response to BD vs better effort?? CXR 2015:  CM with pulmonary venous congestion. No change in breathing with trial of symbicort.   - Spirometry 03/08/2017  Restrictive changes only while sob at rest   - The proper method of use, as well as anticipated side effects, of a metered-dose inhaler are discussed and demonstrated to the patient. Improved effectiveness after extensive coaching during this visit to a level of approximately 90 % from a baseline of 50 % > ok to continue dpi, esp since cough not a problem    Pt appears more anixous than sob to me and focused on access to BREO 200 whereas I'm not even convinced she has asthma:  No assoc cough/ no airflow obst with active symptoms and sleeps fine on cpap.  Reassured her we can provide samples short term but can't solve her job/finance issues   Will offer to see back in one month for f/u and given samples in meantime ? [facility] samples ?   I had an extended discussion with the patient reviewing all relevant studies completed to date and  lasting 25 minutes of a 40  minute acute  visit with pt not previously known to me   re  severe non-specific but potentially very serious refractory respiratory symptoms of uncertain and potentially multiple  etiologies.   Each maintenance medication was reviewed in detail including most importantly the difference between maintenance and prns and under what circumstances the prns are to be triggered using an action plan format that is not reflected in the computer generated alphabetically organized AVS.    Please see AVS for specific instructions unique to this office visit that I personally wrote and verbalized to the the pt in detail and then reviewed with pt  by my nurse highlighting any changes in therapy/plan of care  recommended at today's visit.

## 2017-03-08 NOTE — Telephone Encounter (Signed)
Pt is being seen by MW today. Pt's vitals were checked and normal.

## 2017-03-08 NOTE — Patient Instructions (Addendum)
Restart Breo 200 one click each am    Please schedule a follow up office visit in 4 weeks, sooner if needed to see one of our NP's - bring all your medications

## 2017-03-08 NOTE — Assessment & Plan Note (Signed)
Body mass index is 50.62 kg/m.  -  trending up No results found for: TSH   Contributing to gerd risk/ doe/reviewed the need and the process to achieve and maintain neg calorie balance > defer f/u primary care including intermittently monitoring thyroid status

## 2017-03-31 ENCOUNTER — Telehealth: Payer: Self-pay | Admitting: Pulmonary Disease

## 2017-03-31 MED ORDER — FLUTICASONE FUROATE-VILANTEROL 200-25 MCG/INH IN AEPB: 1.0000 | INHALATION_SPRAY | Freq: Every day | RESPIRATORY_TRACT | 0 refills | Status: DC

## 2017-03-31 NOTE — Telephone Encounter (Signed)
Pt requesting a sample of Breo 200 to last her until her appt with TP 04/05/17.  Pt also requesting TB letter to be re-printed so that she can give this to her new job.  Sample given to patient and letter printed. Nothing further needed.

## 2017-04-05 ENCOUNTER — Ambulatory Visit (INDEPENDENT_AMBULATORY_CARE_PROVIDER_SITE_OTHER): Payer: Self-pay | Admitting: Adult Health

## 2017-04-05 ENCOUNTER — Encounter: Payer: Self-pay | Admitting: Adult Health

## 2017-04-05 DIAGNOSIS — G4733 Obstructive sleep apnea (adult) (pediatric): Secondary | ICD-10-CM

## 2017-04-05 DIAGNOSIS — J4520 Mild intermittent asthma, uncomplicated: Secondary | ICD-10-CM

## 2017-04-05 MED ORDER — FLUTICASONE FUROATE-VILANTEROL 200-25 MCG/INH IN AEPB: 1.0000 | INHALATION_SPRAY | Freq: Every day | RESPIRATORY_TRACT | 3 refills | Status: DC

## 2017-04-05 MED ORDER — FLUTICASONE FUROATE-VILANTEROL 200-25 MCG/INH IN AEPB
1.0000 | INHALATION_SPRAY | Freq: Every day | RESPIRATORY_TRACT | Status: DC
  Administered 2017-04-05 – 2017-04-06 (×2): 1 via RESPIRATORY_TRACT

## 2017-04-05 NOTE — Assessment & Plan Note (Signed)
Improved sx control on BREO  No insurance will need pt assistance.   Plan  . Patient Instructions  CPAP download request.  Continue on CPAP At bedtime   Restart BREO 1 puff daily .  Papers for pt assistance completed.  follow up Dr. Sherene SiresWert  In 3 months and As needed   Please contact office for sooner follow up if symptoms do not improve or worsen or seek emergency care

## 2017-04-05 NOTE — Addendum Note (Signed)
Addended by: Boone MasterJONES, Mister Krahenbuhl E on: 04/05/2017 03:39 PM   Modules accepted: Orders

## 2017-04-05 NOTE — Addendum Note (Signed)
Addended by: Roselyn BeringKING, Alizandra Loh A on: 04/05/2017 03:44 PM   Modules accepted: Orders

## 2017-04-05 NOTE — Patient Instructions (Addendum)
CPAP download request.  Continue on CPAP At bedtime   Restart BREO 1 puff daily .  Papers for pt assistance completed.  follow up Dr. Sherene SiresWert  In 3 months and As needed   Please contact office for sooner follow up if symptoms do not improve or worsen or seek emergency care

## 2017-04-05 NOTE — Progress Notes (Signed)
@Patient  ID: Gail Jordan, female    DOB: [birth year], 51 y.o.   MRN: [id number]  Chief Complaint  Patient presents with  . Follow-up    Referring provider: Gwenyth Bender, MD  HPI: 51 year old female followed for severe sleep apnea and dyspnea/? Asthma   TEST  PFT's 09/2014:  Poor understanding of technique, no consistent trials, DLCO very inaccurate, ??+response to BD vs better effort?? - Spirometry 03/08/2017  Restrictive changes only while sob at rest   04/05/2017 Follow up : Dyspnea /? Asthma  Pt returns for a 2 week follow up . She was started on BREO last ov for possible asthma  She says it really helped. She has run out of sample . She does not have any insurance .  Says she started new job last week and may be able to get insurance  No cough or wheezing .  Gets sob with prolonged walking .   She says she recently started on CPAP . Was able to get money to afford the copay for the assistance for CPAP .  Wearing it about 5-6 hr each night .  Feels more rested some .   Allergies  Allergen Reactions  . Antihistamines, Diphenhydramine-Type Itching, Swelling and Other (See Comments)    Reaction:  Eye swelling     Immunization History  Administered Date(s) Administered  . Influenza Split 12/10/2011  . Influenza-Unspecified 08/08/2014  . PPD Test 10/25/2016    Past Medical History:  Diagnosis Date  . High cholesterol   . Hyperlipidemia   . Hypertension   . Obesity   . Sleep apnea     Tobacco History: History  Smoking Status  . Former Smoker  . Years: 2.00  . Types: Cigarettes  . Quit date: 11/08/2010  Smokeless Tobacco  . Never Used    Comment: 1 pack per 1-2 weeks.    Counseling given: Not Answered   Outpatient Encounter Prescriptions as of 04/05/2017  Medication Sig  . acetaminophen (TYLENOL) 500 MG tablet Take 500 mg by mouth every 6 (six) hours as needed for mild pain, moderate pain, fever or headache.   Marland Kitchen atorvastatin (LIPITOR) 20 MG tablet Take 20  mg by mouth daily.  . fluticasone furoate-vilanterol (BREO ELLIPTA) 200-25 MCG/INH AEPB Inhale 1 puff into the lungs daily.  . fluticasone furoate-vilanterol (BREO ELLIPTA) 200-25 MCG/INH AEPB Inhale 1 puff into the lungs daily.  Marland Kitchen losartan (COZAAR) 50 MG tablet Take 50 mg by mouth daily.   No facility-administered encounter medications on file as of 04/05/2017.      Review of Systems  Constitutional:   No  weight loss, night sweats,  Fevers, chills,  +fatigue, or  lassitude.  HEENT:   No headaches,  Difficulty swallowing,  Tooth/dental problems, or  Sore throat,                No sneezing, itching, ear ache, nasal congestion, post nasal drip,   CV:  No chest pain,  Orthopnea, PND, swelling in lower extremities, anasarca, dizziness, palpitations, syncope.   GI  No heartburn, indigestion, abdominal pain, nausea, vomiting, diarrhea, change in bowel habits, loss of appetite, bloody stools.   Resp:    No excess mucus, no productive cough,  No non-productive cough,  No coughing up of blood.  No change in color of mucus.  No wheezing.  No chest wall deformity  Skin: no rash or lesions.  GU: no dysuria, change in color of urine, no urgency or frequency.  No flank  pain, no hematuria   MS:  No joint pain or swelling.  No decreased range of motion.  No back pain.    Physical Exam  BP (!) 142/84 (BP Location: Left Wrist, Cuff Size: Normal)   Pulse 82   Ht 5\' 6"  (1.676 m)   Wt (!) 305 lb 9.6 oz (138.6 kg)   SpO2 96%   BMI 49.33 kg/m   GEN: A/Ox3; pleasant , NAD, obese    HEENT:  [place]/AT,  EACs-clear, TMs-wnl, NOSE-clear, THROAT-clear, no lesions, no postnasal drip or exudate noted. Class 2 MP airway   NECK:  Supple w/ fair ROM; no JVD; normal carotid impulses w/o bruits; no thyromegaly or nodules palpated; no lymphadenopathy.    RESP  Clear  P & A; w/o, wheezes/ rales/ or rhonchi. no accessory muscle use, no dullness to percussion  CARD:  RRR, no m/r/g, no peripheral edema, pulses  intact, no cyanosis or clubbing.  GI:   Soft & nt; nml bowel sounds; no organomegaly or masses detected.   Musco: Warm bil, no deformities or joint swelling noted.   Neuro: alert, no focal deficits noted.    Skin: Warm, no lesions or rashes    Lab Results:  CBC    Component Value Date/Time   WBC 5.8 03/04/2017 1945   RBC 4.54 03/04/2017 1945   HGB 12.5 03/04/2017 1945   HCT 38.6 03/04/2017 1945   PLT 218 03/04/2017 1945   MCV 85.0 03/04/2017 1945   MCH 27.5 03/04/2017 1945   MCHC 32.4 03/04/2017 1945   RDW 14.3 03/04/2017 1945   LYMPHSABS 1.6 10/08/2016 1246   MONOABS 0.4 10/08/2016 1246   EOSABS 0.1 10/08/2016 1246   BASOSABS 0.0 10/08/2016 1246    BMET    Component Value Date/Time   NA 140 03/04/2017 1945   K 3.9 03/04/2017 1945   CL 106 03/04/2017 1945   CO2 27 03/04/2017 1945   GLUCOSE 188 (H) 03/04/2017 1945   BUN 13 03/04/2017 1945   CREATININE 0.77 03/04/2017 1945   CALCIUM 9.3 03/04/2017 1945   GFRNONAA >60 03/04/2017 1945   GFRAA >60 03/04/2017 1945    BNP    Component Value Date/Time   BNP 23.4 03/04/2017 1945    ProBNP    Component Value Date/Time   PROBNP 30.0 10/08/2016 1246    Imaging: No results found.   Assessment & Plan:   OSA (obstructive sleep apnea) Severe OSA , now on CPAP  Get CPAP download  Cont on CPAP At bedtime   Wt loss .   Asthma Improved sx control on BREO  No insurance will need pt assistance.   Plan  . Patient Instructions  CPAP download request.  Continue on CPAP At bedtime   Restart BREO 1 puff daily .  Papers for pt assistance completed.  follow up Dr. Sherene SiresWert  In 3 months and As needed   Please contact office for sooner follow up if symptoms do not improve or worsen or seek emergency care         Rubye Oaksammy Shatori Bertucci, NP 04/05/2017

## 2017-04-05 NOTE — Addendum Note (Signed)
Addended by: Roselyn BeringKING, Kasidi Shanker A on: 04/05/2017 04:32 PM   Modules accepted: Orders

## 2017-04-05 NOTE — Assessment & Plan Note (Signed)
Severe OSA , now on CPAP  Get CPAP download  Cont on CPAP At bedtime   Wt loss .

## 2017-04-06 NOTE — Progress Notes (Signed)
Chart and office note reviewed in detail  > agree with a/p as outlined    

## 2017-04-13 ENCOUNTER — Encounter (HOSPITAL_COMMUNITY): Payer: Self-pay | Admitting: Emergency Medicine

## 2017-04-13 ENCOUNTER — Emergency Department (HOSPITAL_COMMUNITY)
Admission: EM | Admit: 2017-04-13 | Discharge: 2017-04-13 | Disposition: A | Discharge status: Home / Self Care | Payer: Self-pay | Attending: Emergency Medicine | Admitting: Emergency Medicine

## 2017-04-13 DIAGNOSIS — Z87891 Personal history of nicotine dependence: Secondary | ICD-10-CM | POA: Insufficient documentation

## 2017-04-13 DIAGNOSIS — J45909 Unspecified asthma, uncomplicated: Secondary | ICD-10-CM | POA: Insufficient documentation

## 2017-04-13 DIAGNOSIS — Z79899 Other long term (current) drug therapy: Secondary | ICD-10-CM | POA: Insufficient documentation

## 2017-04-13 DIAGNOSIS — I10 Essential (primary) hypertension: Secondary | ICD-10-CM | POA: Insufficient documentation

## 2017-04-13 DIAGNOSIS — Z760 Encounter for issue of repeat prescription: Secondary | ICD-10-CM | POA: Insufficient documentation

## 2017-04-13 MED ORDER — ALBUTEROL SULFATE HFA 108 (90 BASE) MCG/ACT IN AERS
1.0000 | INHALATION_SPRAY | Freq: Once | RESPIRATORY_TRACT | Status: AC
  Administered 2017-04-13: 1 via RESPIRATORY_TRACT
  Filled 2017-04-13: qty 6.7

## 2017-04-13 NOTE — ED Triage Notes (Signed)
Pt states she attempted to use inhaler this morning and inhaler was empty. Pt needs medication refill and has not been able to get any samples.

## 2017-04-13 NOTE — ED Provider Notes (Signed)
[facility]-EMERGENCY DEPT Provider Note   CSN: [id number] Arrival date & time: 04/13/17  1142  By signing my name below, I, Phillips Climes, attest that this documentation has been prepared under the direction and in the presence of Cristina Gong, [place]. Electronically Signed: Phillips Climes, Scribe. 04/13/2017. 7:44 PM.   History   Chief Complaint Chief Complaint  Patient presents with  . Medication Refill   HPI Comments Gail Jordan is a 51 y.o. female with a questionable PMHx for asthma, but with known OSA, obesity and DOE, who presents to the Emergency Department requesting a refill of her albuterol inhaler.  Per chart review, pt follows with [facility], who initially prescribed this, as pt is uninsured and unable to obtain a CPAP machine.  Pt with multiple visits requesting samples of medication.  Here, pt endorses intermittent dyspnea, which is a chronic problem for her.  She is currently asymptomatic.  The history is provided by the patient. No language interpreter was used.   Past Medical History:  Diagnosis Date  . High cholesterol   . Hyperlipidemia   . Hypertension   . Obesity   . Sleep apnea     Patient Active Problem List   Diagnosis Date Noted  . 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 Chronic rhinitis 10/08/2016  . Asthma 07/05/2016  . Morbid (severe) obesity due to excess calories (HCC) 03/09/2016  . DOE (dyspnea on exertion) 09/03/2014  . Hypertension 01/04/2013  . Dyslipidemia 01/04/2013  . GERD (gastroesophageal reflux disease) 01/04/2013  . OSA (obstructive sleep apnea) 10/27/2012    Past Surgical History:  Procedure Laterality Date  . RETAINED PLACENTA REMOVAL     after child birth  . STOMACH SURGERY     possibly???    OB History    Gravida Para Term Preterm AB Living   5 2 2  0 3 2   SAB TAB Ectopic Multiple Live Births   0 3 0 0         Home Medications    Prior to Admission medications   Medication Sig Start Date End Date Taking?  Authorizing Provider  acetaminophen (TYLENOL) 500 MG tablet Take 500 mg by mouth every 6 (six) hours as needed for mild pain, moderate pain, fever or headache.     [provider]  atorvastatin (LIPITOR) 20 MG tablet Take 20 mg by mouth daily.    [provider]  fluticasone furoate-vilanterol (BREO ELLIPTA) 200-25 MCG/INH AEPB Inhale 1 puff into the lungs daily. 04/05/17   Parrett, Virgel Bouquet, NP  losartan (COZAAR) 50 MG tablet Take 50 mg by mouth daily.    [provider]    Family History Family History  Problem Relation Age of Onset  . Hypertension Father   . Hypertension Sister     Social History Social History  Substance Use Topics  . Smoking status: Former Smoker    Years: 2.00    Types: Cigarettes    Quit date: 11/08/2010  . Smokeless tobacco: Never Used     Comment: 1 pack per 1-2 weeks.   . Alcohol use Yes     Comment: once a month--wine     Allergies   Antihistamines, diphenhydramine-type   Review of Systems Review of Systems  Constitutional: Negative for activity change, chills, fatigue and fever.  Respiratory: Positive for shortness of breath (Resolved). Negative for cough, chest tightness and wheezing.   Cardiovascular: Negative for chest pain and leg swelling.   Physical Exam Updated Vital Signs  BP 140/78 (BP Location: Left Arm)   Pulse 78   Temp 98 F (36.7 C) (Oral)   Resp 16   SpO2 99%   Physical Exam  Constitutional: She appears well-developed and well-nourished. No distress.  HENT:  Head: Normocephalic and atraumatic.  Neck: Normal range of motion.  Cardiovascular: Normal rate, regular rhythm and normal heart sounds.   Pulmonary/Chest: Effort normal and breath sounds normal. No respiratory distress. She has no wheezes. She has no rales.  Psychiatric: She has a normal mood and affect.  Nursing note and vitals reviewed.   ED Treatments / Results  DIAGNOSTIC STUDIES: Oxygen Saturation is 99% on room air, normal by my  interpretation.    COORDINATION OF CARE: 12:30 PM Discussed treatment plan with pt at bedside and pt agreed to plan.  Labs (all labs ordered are listed, but only abnormal results are displayed) Labs Reviewed - No data to display  EKG  EKG Interpretation None      Radiology No results found.  Procedures Procedures (including critical care time)  Medications Ordered in ED Medications  albuterol (PROVENTIL HFA;VENTOLIN HFA) 108 (90 Base) MCG/ACT inhaler 1 puff (1 puff Inhalation Given 04/13/17 1307)    Initial Impression / Assessment and Plan / ED Course  I have reviewed the triage vital signs and the nursing notes.  Pertinent labs & imaging results that were available during my care of the patient were reviewed by me and considered in my medical decision making (see chart for details).   Patient reports she is unable to pay for inhaler if given prescription. Patient given inhaler (and one puff) here while in the emergency room and given the inhaler to take home.  was discussed with patient that obtaining refills or "samples" in the emergency room is not ideal and that she needs to follow up with her PCP for future refills.  Patient states her understanding.   Patient also given information for wellness clinic as she does not have insurance.   At this time there does not appear to be any evidence of an acute emergency medical condition and the patient appears stable for discharge with appropriate outpatient follow up.Diagnosis was discussed with patient who verbalizes understanding and is agreeable to discharge.     I personally performed the services described in this documentation, which was scribed in my presence. The recorded information has been reviewed and is accurate.   Final Clinical Impressions(s) / ED Diagnoses   Final diagnoses:  Medication refill    New Prescriptions Discharge Medication List as of 04/13/2017  1:04 PM       Cristina GongHammond, Elizabeth W, PA-C 04/13/17  1947    Tegeler, Canary Brimhristopher J, MD 04/13/17 [phone]

## 2017-05-09 MED FILL — ?ATORVASTATIN 20 MG TABLET: 20 | 30 days supply | Qty: 30 | Fill #0

## 2017-05-09 MED FILL — LOSARTAN POTASSIUM 100 MG T: 100 | 30 days supply | Qty: 30 | Fill #0

## 2017-05-09 MED FILL — **BREO ELLIPTA 200-25 MCG I: 200-25 MCG | 30 days supply | Qty: 60 | Fill #0

## 2017-06-02 MED FILL — METOPROLOL SUCC ER 50 MG TA: 50 | 30 days supply | Qty: 30 | Fill #0

## 2017-06-02 MED FILL — ?SPIRONOLACTONE 25 MG TABLE: 25 | 30 days supply | Qty: 30 | Fill #0

## 2017-06-02 MED FILL — !VENTOLIN HFA INHALER: 108 (90 BAS | 25 days supply | Qty: 18 | Fill #0

## 2017-06-02 MED FILL — ATORVASTATIN 20 MG TABLET: 20 | 30 days supply | Qty: 30 | Fill #1

## 2017-06-02 MED FILL — **BREO ELLIPTA 200-25 MCG I: 200-25 MCG | 28 days supply | Qty: 56 | Fill #1

## 2017-06-02 MED FILL — MELOXICAM 7.5 MG TABLET: 7.5 | 30 days supply | Qty: 30 | Fill #0

## 2017-06-02 MED FILL — MONTELUKAST SOD 10 MG TAB: 10 | 30 days supply | Qty: 30 | Fill #0

## 2017-06-02 MED FILL — ?FUROSEMIDE 20 MG TABLET: 20 | 30 days supply | Qty: 30 | Fill #0

## 2017-06-12 ENCOUNTER — Encounter (HOSPITAL_COMMUNITY): Payer: Self-pay | Admitting: *Deleted

## 2017-06-12 ENCOUNTER — Emergency Department (HOSPITAL_COMMUNITY)
Admission: EM | Admit: 2017-06-12 | Discharge: 2017-06-12 | Disposition: A | Discharge status: Home / Self Care | Payer: PRIVATE HEALTH INSURANCE | Attending: Emergency Medicine | Admitting: Emergency Medicine

## 2017-06-12 DIAGNOSIS — Z79899 Other long term (current) drug therapy: Secondary | ICD-10-CM | POA: Diagnosis not present

## 2017-06-12 DIAGNOSIS — Z87891 Personal history of nicotine dependence: Secondary | ICD-10-CM | POA: Diagnosis not present

## 2017-06-12 DIAGNOSIS — J45909 Unspecified asthma, uncomplicated: Secondary | ICD-10-CM | POA: Diagnosis not present

## 2017-06-12 DIAGNOSIS — I10 Essential (primary) hypertension: Secondary | ICD-10-CM | POA: Insufficient documentation

## 2017-06-12 DIAGNOSIS — K047 Periapical abscess without sinus: Secondary | ICD-10-CM | POA: Insufficient documentation

## 2017-06-12 DIAGNOSIS — K0889 Other specified disorders of teeth and supporting structures: Secondary | ICD-10-CM | POA: Diagnosis present

## 2017-06-12 MED ORDER — TRAMADOL HCL 50 MG PO TABS: 50.0000 mg | ORAL_TABLET | Freq: Four times a day (QID) | ORAL | 0 refills | Status: DC | PRN

## 2017-06-12 MED ORDER — CLINDAMYCIN PHOSPHATE 600 MG/50ML IV SOLN
600.0000 mg | Freq: Once | INTRAVENOUS | Status: AC
  Administered 2017-06-12: 600 mg via INTRAVENOUS
  Filled 2017-06-12: qty 50

## 2017-06-12 MED ORDER — CLINDAMYCIN HCL 300 MG PO CAPS: 300.0000 mg | ORAL_CAPSULE | Freq: Four times a day (QID) | ORAL | 0 refills | Status: DC

## 2017-06-12 MED ORDER — OXYCODONE-ACETAMINOPHEN 5-325 MG PO TABS
2.0000 | ORAL_TABLET | Freq: Once | ORAL | Status: AC
  Administered 2017-06-12: 2 via ORAL
  Filled 2017-06-12: qty 2

## 2017-06-12 NOTE — ED Provider Notes (Signed)
[facility]-EMERGENCY DEPT Provider Note   CSN: [id number] Arrival date & time: 06/12/17  0807     History   Chief Complaint Chief Complaint  Patient presents with  . Dental Pain    HPI Gail Jordan is a 51 y.o. female.  Patient is a 51 year old female with a history of hypertension and hyperlipidemia who presents with left-sided dental pain. She states that her tooth is been hurting her for about 2 weeks but has been worse over the last 3 days. She noted that her face started to have some swelling over the last 3 days. She denies any difficulty swallowing. No trouble swallowing her spit. No shortness of breath. She doesn't have any sensation that her airway is swollen. She has some subjective fevers at home. No vomiting. She has been taking over-the-counter medications without improvement in symptoms.      Past Medical History:  Diagnosis Date  . High cholesterol   . Hyperlipidemia   . Hypertension   . Obesity   . Sleep apnea     Patient Active Problem List   Diagnosis Date Noted  . 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 Chronic rhinitis 10/08/2016  . Asthma 07/05/2016  . Morbid (severe) obesity due to excess calories (HCC) 03/09/2016  . DOE (dyspnea on exertion) 09/03/2014  . Hypertension 01/04/2013  . Dyslipidemia 01/04/2013  . GERD (gastroesophageal reflux disease) 01/04/2013  . OSA (obstructive sleep apnea) 10/27/2012    Past Surgical History:  Procedure Laterality Date  . RETAINED PLACENTA REMOVAL     after child birth  . STOMACH SURGERY     possibly???    OB History    Gravida Para Term Preterm AB Living   5 2 2  0 3 2   SAB TAB Ectopic Multiple Live Births   0 3 0 0         Home Medications    Prior to Admission medications   Medication Sig Start Date End Date Taking? Authorizing Provider  atorvastatin (LIPITOR) 20 MG tablet Take 20 mg by mouth daily.   Yes [provider]  fluticasone furoate-vilanterol (BREO ELLIPTA) 200-25 MCG/INH AEPB Inhale 1  puff into the lungs daily. 04/05/17  Yes Parrett, Tammy S, NP  furosemide (LASIX) 20 MG tablet Take 20 mg by mouth daily. 06/02/17  Yes [provider]  ibuprofen (ADVIL,MOTRIN) 200 MG tablet Take 400 mg by mouth every 6 (six) hours as needed for mild pain.   Yes [provider]  meloxicam (MOBIC) 7.5 MG tablet Take 7.5 mg by mouth daily. 06/02/17  Yes [provider]  metoprolol succinate (TOPROL-XL) 50 MG 24 hr tablet Take 50 mg by mouth daily. 06/02/17  Yes [provider]  montelukast (SINGULAIR) 10 MG tablet Take 10 mg by mouth every evening. 06/02/17  Yes [provider]  spironolactone (ALDACTONE) 25 MG tablet Take 25 mg by mouth daily. 06/02/17  Yes [provider]  VENTOLIN HFA 108 (90 Base) MCG/ACT inhaler Take 1 puff by mouth every 6 (six) hours as needed for shortness of breath or wheezing. 06/02/17  Yes [provider]  acetaminophen (TYLENOL) 500 MG tablet Take 500 mg by mouth every 6 (six) hours as needed for mild pain, moderate pain, fever or headache.     [provider]  clindamycin (CLEOCIN) 300 MG capsule Take 1 capsule (300 mg total) by mouth 4 (four) times daily. X 7 days 06/12/17   Rolan Bucco, MD  traMADol (ULTRAM) 50 MG tablet Take 1 tablet (50  mg total) by mouth every 6 (six) hours as needed. 06/12/17   Rolan BuccoBelfi, Raymir Frommelt, MD    Family History Family History  Problem Relation Age of Onset  . Hypertension Father   . Hypertension Sister     Social History Social History  Substance Use Topics  . Smoking status: Former Smoker    Years: 2.00    Types: Cigarettes    Quit date: 11/08/2010  . Smokeless tobacco: Never Used     Comment: 1 pack per 1-2 weeks.   . Alcohol use Yes     Comment: once a month--wine     Allergies   Antihistamines, diphenhydramine-type   Review of Systems Review of Systems  Constitutional: Positive for fever. Negative for chills, diaphoresis and fatigue.  HENT: Positive for  dental problem and facial swelling. Negative for congestion, rhinorrhea and sneezing.   Eyes: Negative.   Respiratory: Negative for cough, chest tightness and shortness of breath.   Cardiovascular: Negative for chest pain and leg swelling.  Gastrointestinal: Negative for abdominal pain, blood in stool, diarrhea, nausea and vomiting.  Genitourinary: Negative for difficulty urinating, flank pain, frequency and hematuria.  Musculoskeletal: Negative for arthralgias and back pain.  Skin: Negative for rash.  Neurological: Negative for dizziness, speech difficulty, weakness, numbness and headaches.     Physical Exam Updated Vital Signs BP (!) 166/95 (BP Location: Right Arm)   Pulse 78   Temp 99.3 F (37.4 C) (Oral)   Resp (!) 22   Ht 5\' 6"  (1.676 m)   Wt 124.7 kg (275 lb)   SpO2 99%   BMI 44.39 kg/m   Physical Exam  Constitutional: She is oriented to person, place, and time. She appears well-developed and well-nourished.  HENT:  Head: Normocephalic and atraumatic.  Positive mild facial swelling adjacent to the left upper back molar with tenderness on palpation to this area. There is no pointing or fluctuance. No trismus. No other facial swelling is identified. Uvula is midline.  Eyes: Pupils are equal, round, and reactive to light.  Neck: Normal range of motion. Neck supple.  Cardiovascular: Normal rate, regular rhythm and normal heart sounds.   Pulmonary/Chest: Effort normal and breath sounds normal. No respiratory distress. She has no wheezes. She has no rales. She exhibits no tenderness.  Abdominal: Soft. Bowel sounds are normal. There is no tenderness. There is no rebound and no guarding.  Musculoskeletal: Normal range of motion. She exhibits no edema.  Lymphadenopathy:    She has no cervical adenopathy.  Neurological: She is alert and oriented to person, place, and time.  Skin: Skin is warm and dry. No rash noted.  Psychiatric: She has a normal mood and affect.     ED  Treatments / Results  Labs (all labs ordered are listed, but only abnormal results are displayed) Labs Reviewed - No data to display  EKG  EKG Interpretation None       Radiology No results found.  Procedures Procedures (including critical care time)  Medications Ordered in ED Medications  clindamycin (CLEOCIN) IVPB 600 mg (600 mg Intravenous New Bag/Given 06/12/17 0932)  oxyCODONE-acetaminophen (PERCOCET/ROXICET) 5-325 MG per tablet 2 tablet (2 tablets Oral Given 06/12/17 0932)     Initial Impression / Assessment and Plan / ED Course  I have reviewed the triage vital signs and the nursing notes.  Pertinent labs & imaging results that were available during my care of the patient were reviewed by me and considered in my medical decision making (see chart for details).  Patient presents with a small amount of swelling adjacent to the left upper back molar. She was started on clindamycin. There is no clinical evidence of further extension of the abscess. No evidence of airway involvement. She's otherwise well-appearing. She was discharged home in good condition. She was encouraged to have close follow-up with the dentist. Return precautions were given.  Final Clinical Impressions(s) / ED Diagnoses   Final diagnoses:  Dental abscess    New Prescriptions New Prescriptions   CLINDAMYCIN (CLEOCIN) 300 MG CAPSULE    Take 1 capsule (300 mg total) by mouth 4 (four) times daily. X 7 days   TRAMADOL (ULTRAM) 50 MG TABLET    Take 1 tablet (50 mg total) by mouth every 6 (six) hours as needed.     Rolan BuccoBelfi, Arias Weinert, MD 06/12/17 1025

## 2017-06-12 NOTE — ED Triage Notes (Signed)
Pt reports severe left side dental pain x 3 days. Facial swelling noted. Pt feels like the swelling is going down into her neck also. Reports fever. Airway intact at triage.

## 2017-07-04 MED FILL — MELOXICAM 7.5 MG TABLET: 7.5 | 30 days supply | Qty: 30 | Fill #1

## 2017-07-04 MED FILL — ?MONTELUKAST SOD 10MG TAB: 10 | 30 days supply | Qty: 30 | Fill #1

## 2017-07-04 MED FILL — ?SPIRONOLACTONE 25 MG TABLE: 25 | 30 days supply | Qty: 30 | Fill #1

## 2017-07-04 MED FILL — ?ATORVASTATIN 20 MG TABLET: 20 | 30 days supply | Qty: 30 | Fill #2

## 2017-07-04 MED FILL — !VENTOLIN HFA INHALER: 108 (90 BAS | 25 days supply | Qty: 18 | Fill #1

## 2017-07-04 MED FILL — ?FUROSEMIDE 20 MG TABLET: 20 | 30 days supply | Qty: 30 | Fill #1

## 2017-07-04 MED FILL — **BREO ELLIPTA 200-25 MCG I: 200-25 MCG | 30 days supply | Qty: 60 | Fill #2

## 2017-07-04 MED FILL — METOPROLOL SUCC ER 50 MG TA: 50 | 30 days supply | Qty: 30 | Fill #1

## 2017-07-08 ENCOUNTER — Ambulatory Visit (INDEPENDENT_AMBULATORY_CARE_PROVIDER_SITE_OTHER): Payer: Self-pay | Admitting: Internal Medicine

## 2017-07-08 ENCOUNTER — Encounter: Payer: Self-pay | Admitting: Internal Medicine

## 2017-07-08 VITALS — BP 124/82 | HR 71 | Ht 66.0 in | Wt 296.0 lb

## 2017-07-08 DIAGNOSIS — R0609 Other forms of dyspnea: Secondary | ICD-10-CM

## 2017-07-08 DIAGNOSIS — G4733 Obstructive sleep apnea (adult) (pediatric): Secondary | ICD-10-CM

## 2017-07-08 MED ORDER — VENTOLIN HFA 108 (90 BASE) MCG/ACT IN AERS: 2.0000 | INHALATION_SPRAY | RESPIRATORY_TRACT | 3 refills | Status: DC | PRN

## 2017-07-08 NOTE — Progress Notes (Signed)
Subjective:     Patient ID: Gail Karma GreaserM Jordan, female   DOB: [birth year],   MRN: [id number]      Brief patient profile:  89 yobf  Quit smoking 2012 with doe eval by [facility] in 2015 with dx obesity/osa/ deconditioning seen in ER 03/04/17 with panic attack and not better so walked into lobby requesting samples of breo (see ov 09/08/16)     History of Present Illness  03/08/2017 1st  office visit/ Ahmet Schank  Up  Chief Complaint  Patient presents with  . Shortness of Breath    ACUTE VISIT: AD patient. Pt walked in c/o SOB, wheezing, and dizzy.   acute onset sob x 5 days just as bad at rest as walking/  s assoc cough or wheeze  thinks it's because she's not been able to purchase Breo x one month off it  rec Restart Breo 200 one click each am    NP 04/05/17 CPAP download request.  Continue on CPAP At bedtime   Restart BREO 1 puff daily .  Papers for pt assistance completed.  follow up Dr. Sherene SiresWert  In 3 months and As needed      07/08/2017  f/u ov/Mumin Denomme re: asthma on breo 200 daily / 100 has failed in past  Chief Complaint  Patient presents with  . Follow-up    Breathing is "a little better".    was sleeping on cpap but mask broke and unable to identify who to call for repair  No need for sba as long as on breo 200   No obvious day to day or daytime variability or assoc excess/ purulent sputum or mucus plugs or hemoptysis or cp or chest tightness, subjective wheeze or overt sinus or hb symptoms. No unusual exp hx or h/o childhood pna/ asthma or knowledge of premature birth.  Sleeping ok without nocturnal  or early am exacerbation  of respiratory  c/o's or need for noct saba. Also denies any obvious fluctuation of symptoms with weather or environmental changes or other aggravating or alleviating factors except as outlined above   Current Medications, Allergies, Complete Past Medical History, Past Surgical History, Family History, and Social History were reviewed in [facility] electronic medical  record.  ROS  The following are not active complaints unless bolded sore throat, dysphagia, dental problems, itching, sneezing,  nasal congestion or excess/ purulent secretions, ear ache,   fever, chills, sweats, unintended wt loss, classically pleuritic or exertional cp,  orthopnea pnd or leg swelling, presyncope, palpitations, abdominal pain, anorexia, nausea, vomiting, diarrhea  or change in bowel or bladder habits, change in stools or urine, dysuria,hematuria,  rash, arthralgias, visual complaints, headache, numbness, weakness or ataxia or problems with walking or coordination,  change in mood/affect or memory.             Objective:   Physical Exam     anxious massively obese bf nad   07/08/2017      296   03/08/17 (!) 313 lb 9.6 oz (142.2 kg)  03/04/17 275 lb (124.7 kg)  11/16/16 (!) 310 lb (140.6 kg)    Vital signs reviewed - Note on arrival 02 sats  95% on RA   HEENT: nl dentition, turbinates bilaterally, and oropharynx. Nl external ear canals without cough reflex   NECK :  without JVD/Nodes/TM/ nl carotid upstrokes bilaterally   LUNGS: no acc muscle use,  Nl contour chest which is clear to A and P bilaterally without cough on insp or exp maneuvers   CV:  RRR  no s3 or murmur or increase in P2, and only trace bilateral ankle edema   ABD:  massivel obese/soft and nontender with nl inspiratory excursion in the supine position. No bruits or organomegaly appreciated, bowel sounds nl  MS:  Nl gait/ ext warm without deformities, calf tenderness, cyanosis or clubbing No obvious joint restrictions   SKIN: warm and dry without lesions    NEURO:  alert, approp, nl sensorium with  no motor or cerebellar deficits apparent.     Labs  reviewed:      Chemistry      Component Value Date/Time   NA 140 03/04/2017 1945   K 3.9 03/04/2017 1945   CL 106 03/04/2017 1945   CO2 27 03/04/2017 1945   BUN 13 03/04/2017 1945   CREATININE 0.77 03/04/2017 1945      Component Value  Date/Time   CALCIUM 9.3 03/04/2017 1945   ALKPHOS 48 05/27/2015 1755   AST 38 05/27/2015 1755   ALT 34 05/27/2015 1755   BILITOT 0.8 05/27/2015 1755        Lab Results  Component Value Date   WBC 5.8 03/04/2017   HGB 12.5 03/04/2017   HCT 38.6 03/04/2017   MCV 85.0 03/04/2017   PLT 218 03/04/2017        Lab Results  Component Value Date   PROBNP 30.0 10/08/2016             I personally reviewed images and agree with radiology impression as follows:  CXR:   03/04/17 Borderline cardiomegaly with aortic atherosclerosis. Mild vascular congestion.       Assessment:

## 2017-07-08 NOTE — Patient Instructions (Addendum)
Breo  200 each am - smooth and deep   Only use your albuterol as a rescue medication to be used if you can't catch your breath by resting or doing a relaxed purse lip breathing pattern.  - The less you use it, the better it will work when you need it. - Ok to use up to 2 puffs  every 4 hours if you must but call for immediate appointment if use goes up over your usual need - Don't leave home without it !!  (think of it like the spare tire for your car)   Call the number on your cpap machine for help getting the equipment fixed and if can't figure this out call [facility] [phone] for assistance     Sleep medicine next available

## 2017-07-11 ENCOUNTER — Encounter: Payer: Self-pay | Admitting: Internal Medicine

## 2017-07-11 NOTE — Assessment & Plan Note (Signed)
PFT's 09/2014:  Poor understanding of technique, no consistent trials, DLCO very inaccurate, ??+response to BD vs better effort??  No change in breathing with trial of symbicort.   - Spirometry 03/08/2017  Restrictive changes only while sob at rest   Response to breo 200 suggests asthma which does well as long as has access to it.   Formulary restrictions will be an ongoing challenge for the forseable future and I would be happy to pick an alternative if the pt will first  provide me a list of them but pt  will need to return here for training for any new device that is required eg dpi vs hfa vs respimat.    In meantime we can always provide samples so the patient never runs out of any needed respiratory medications.   Each maintenance medication was reviewed in detail including most importantly the difference between maintenance and as needed and under what circumstances the prns are to be used.  Please see AVS for specific  Instructions which are unique to this visit and I personally typed out  which were reviewed in detail in writing with the patient and a copy provided.

## 2017-07-11 NOTE — Assessment & Plan Note (Signed)
NPSG 11/2012:  AHI 61/hr with desat to 69%  Advised re calling [facility] and our coodinator prn, will set up for sleep medicine f/u

## 2017-07-11 NOTE — Assessment & Plan Note (Signed)
Body mass index is 47.78 kg/m.  -  trending down, encouraged but has a long way to go No results found for: TSH   Contributing to gerd risk/ doe/reviewed the need and the process to achieve and maintain neg calorie balance > defer f/u primary care including intermittently monitoring thyroid status

## 2017-07-18 ENCOUNTER — Ambulatory Visit: Payer: Self-pay | Attending: Family Medicine

## 2017-07-18 MED FILL — **BREO ELLIPTA 200-25 MCG I: 200-25 MCG | 30 days supply | Qty: 60 | Fill #3

## 2017-07-19 ENCOUNTER — Ambulatory Visit: Payer: Self-pay | Attending: Family Medicine | Admitting: Family Medicine

## 2017-07-19 ENCOUNTER — Encounter: Payer: Self-pay | Admitting: Family Medicine

## 2017-07-19 VITALS — BP 142/86 | HR 65 | Temp 97.8°F | Ht 66.0 in | Wt 297.0 lb

## 2017-07-19 DIAGNOSIS — I10 Essential (primary) hypertension: Secondary | ICD-10-CM

## 2017-07-19 DIAGNOSIS — K047 Periapical abscess without sinus: Secondary | ICD-10-CM | POA: Insufficient documentation

## 2017-07-19 DIAGNOSIS — E669 Obesity, unspecified: Secondary | ICD-10-CM | POA: Insufficient documentation

## 2017-07-19 DIAGNOSIS — E7800 Pure hypercholesterolemia, unspecified: Secondary | ICD-10-CM | POA: Insufficient documentation

## 2017-07-19 DIAGNOSIS — J4520 Mild intermittent asthma, uncomplicated: Secondary | ICD-10-CM

## 2017-07-19 DIAGNOSIS — Z9889 Other specified postprocedural states: Secondary | ICD-10-CM | POA: Insufficient documentation

## 2017-07-19 DIAGNOSIS — Z6841 Body Mass Index (BMI) 40.0 and over, adult: Secondary | ICD-10-CM | POA: Insufficient documentation

## 2017-07-19 DIAGNOSIS — G4733 Obstructive sleep apnea (adult) (pediatric): Secondary | ICD-10-CM | POA: Insufficient documentation

## 2017-07-19 DIAGNOSIS — Z888 Allergy status to other drugs, medicaments and biological substances status: Secondary | ICD-10-CM | POA: Insufficient documentation

## 2017-07-19 DIAGNOSIS — K0889 Other specified disorders of teeth and supporting structures: Secondary | ICD-10-CM

## 2017-07-19 DIAGNOSIS — E785 Hyperlipidemia, unspecified: Secondary | ICD-10-CM

## 2017-07-19 MED ORDER — SPIRONOLACTONE 25 MG PO TABS: 25.0000 mg | ORAL_TABLET | Freq: Every day | ORAL | 3 refills | Status: DC

## 2017-07-19 MED ORDER — VENTOLIN HFA 108 (90 BASE) MCG/ACT IN AERS: 2.0000 | INHALATION_SPRAY | RESPIRATORY_TRACT | 3 refills | Status: DC | PRN

## 2017-07-19 MED ORDER — METOPROLOL SUCCINATE ER 50 MG PO TB24: 50.0000 mg | ORAL_TABLET | Freq: Every day | ORAL | 3 refills | Status: DC

## 2017-07-19 MED ORDER — MELOXICAM 7.5 MG PO TABS: 7.5000 mg | ORAL_TABLET | Freq: Every day | ORAL | 1 refills | Status: DC

## 2017-07-19 MED ORDER — FLUTICASONE FUROATE-VILANTEROL 200-25 MCG/INH IN AEPB: 1.0000 | INHALATION_SPRAY | Freq: Every day | RESPIRATORY_TRACT | 3 refills | Status: DC

## 2017-07-19 MED ORDER — MONTELUKAST SODIUM 10 MG PO TABS: 10.0000 mg | ORAL_TABLET | Freq: Every day | ORAL | 3 refills | Status: DC

## 2017-07-19 MED ORDER — ATORVASTATIN CALCIUM 20 MG PO TABS: 20.0000 mg | ORAL_TABLET | Freq: Every day | ORAL | 3 refills | Status: DC

## 2017-07-19 MED FILL — SPIRONOLACTONE 25 MG TABLET: 25 | 30 days supply | Qty: 30 | Fill #2

## 2017-07-19 MED FILL — METOPROLOL SUCC ER 50 MG TA: 50 | 30 days supply | Qty: 30 | Fill #2

## 2017-07-19 NOTE — Progress Notes (Signed)
Subjective:  Patient ID: Gail Jordan, female    DOB: [birth year]  Age: 51 y.o. MRN: [id number]  CC: Dental Pain and Hypertension   HPI Gail Jordan is a 51 year old female with a history of obesity, hypertension, hyperlipidemia, obstructive sleep apnea, asthma who presents today to establish care.  She denies asthma flares but has been using her rescue inhaler daily and Breo as needed. Denies chest pain or shortness of breath.  She has been compliant with her antihypertensives and is requesting refills.  With regards to her obesity she has been going to the gym but complains of body aches whenever she does; she does have meloxicam which helps with her body aches.  Last month she was treated for dental abscess and received clindamycin and Percocet with improvement in symptoms; she endorses intermittent upper and lower premolar pain but denies swelling.  Past Medical History:  Diagnosis Date  . High cholesterol   . Hyperlipidemia   . Hypertension   . Obesity   . Sleep apnea     Past Surgical History:  Procedure Laterality Date  . RETAINED PLACENTA REMOVAL     after child birth  . STOMACH SURGERY     possibly???    Allergies  Allergen Reactions  . Antihistamines, Diphenhydramine-Type Itching, Swelling and Other (See Comments)    Reaction:  Eye swelling       Outpatient Medications Prior to Visit  Medication Sig Dispense Refill  . fluticasone furoate-vilanterol (BREO ELLIPTA) 200-25 MCG/INH AEPB Inhale 1 puff into the lungs daily. 90 each 3  . meloxicam (MOBIC) 7.5 MG tablet Take 7.5 mg by mouth daily.  2  . metoprolol succinate (TOPROL-XL) 50 MG 24 hr tablet Take 50 mg by mouth daily.  3  . spironolactone (ALDACTONE) 25 MG tablet Take 25 mg by mouth daily.  3  . VENTOLIN HFA 108 (90 Base) MCG/ACT inhaler Inhale 2 puffs into the lungs every 4 (four) hours as needed for wheezing or shortness of breath. (Patient not taking: Reported on 07/19/2017) 1 Inhaler 3   No  facility-administered medications prior to visit.     ROS Review of Systems  Constitutional: Negative for activity change, appetite change and fatigue.  HENT: Positive for dental problem. Negative for congestion, sinus pressure and sore throat.   Eyes: Negative for visual disturbance.  Respiratory: Negative for cough, chest tightness, shortness of breath and wheezing.   Cardiovascular: Negative for chest pain and palpitations.  Gastrointestinal: Negative for abdominal distention, abdominal pain and constipation.  Endocrine: Negative for polydipsia.  Genitourinary: Negative for dysuria and frequency.  Musculoskeletal: Negative for arthralgias and back pain.  Skin: Negative for rash.  Neurological: Negative for tremors, light-headedness and numbness.  Hematological: Does not bruise/bleed easily.  Psychiatric/Behavioral: Negative for agitation and behavioral problems.    Objective:  BP (!) 142/86   Pulse 65   Temp 97.8 F (36.6 C) (Oral)   Ht 5' 6"  (1.676 m)   Wt 297 lb (134.7 kg)   SpO2 97%   BMI 47.94 kg/m   BP/Weight 07/19/2017 07/31/3006 04/09/2632  Systolic BP 354 562 563  Diastolic BP 86 82 82  Wt. (Lbs) 297 296 275  BMI 47.94 47.78 44.39     Physical Exam  Constitutional: She is oriented to person, place, and time. She appears well-developed and well-nourished.  HENT:  Mouth/Throat: Oropharynx is clear and moist.  No abscess and mouth, no gum swelling  Cardiovascular: Normal rate, normal heart sounds and intact distal pulses.  No murmur heard. Pulmonary/Chest: Effort normal and breath sounds normal. She has no wheezes. She has no rales. She exhibits no tenderness.  Abdominal: Soft. Bowel sounds are normal. She exhibits no distension and no mass. There is no tenderness.  Musculoskeletal: Normal range of motion.  Neurological: She is alert and oriented to person, place, and time.  Skin: Skin is warm and dry.  Psychiatric: She has a normal mood and affect.    CMP  Latest Ref Rng & Units 03/04/2017 10/08/2016 05/30/2016  Glucose 65 - 99 mg/dL 188(H) 116(H) 218(H)  BUN 6 - 20 mg/dL 13 11 12   Creatinine 0.44 - 1.00 mg/dL 0.77 0.68 0.69  Sodium 135 - 145 mmol/L 140 140 137  Potassium 3.5 - 5.1 mmol/L 3.9 4.2 3.8  Chloride 101 - 111 mmol/L 106 103 104  CO2 22 - 32 mmol/L 27 30 26   Calcium 8.9 - 10.3 mg/dL 9.3 9.5 9.1  Total Protein 6.5 - 8.1 g/dL - - -  Total Bilirubin 0.3 - 1.2 mg/dL - - -  Alkaline Phos 38 - 126 U/L - - -  AST 15 - 41 U/L - - -  ALT 14 - 54 U/L - - -     Assessment & Plan:   1. Mild intermittent asthma without complication Educated on proper inhaler technique and administration - montelukast (SINGULAIR) 10 MG tablet; Take 1 tablet (10 mg total) by mouth at bedtime.  Dispense: 30 tablet; Refill: 3 - fluticasone furoate-vilanterol (BREO ELLIPTA) 200-25 MCG/INH AEPB; Inhale 1 puff into the lungs daily.  Dispense: 90 each; Refill: 3 - VENTOLIN HFA 108 (90 Base) MCG/ACT inhaler; Inhale 2 puffs into the lungs every 4 (four) hours as needed for wheezing or shortness of breath.  Dispense: 1 Inhaler; Refill: 3  2. Essential hypertension Controlled - spironolactone (ALDACTONE) 25 MG tablet; Take 1 tablet (25 mg total) by mouth daily.  Dispense: 30 tablet; Refill: 3 - metoprolol succinate (TOPROL-XL) 50 MG 24 hr tablet; Take 1 tablet (50 mg total) by mouth daily.  Dispense: 30 tablet; Refill: 3 - atorvastatin (LIPITOR) 20 MG tablet; Take 1 tablet (20 mg total) by mouth daily.  Dispense: 30 tablet; Refill: 3 - CMP14+EGFR; Future - Lipid panel; Future  3. Dyslipidemia We'll send a lipid panel  4. Pain, dental - Ambulatory referral to Dentistry   Meds ordered this encounter  Medications  . spironolactone (ALDACTONE) 25 MG tablet    Sig: Take 1 tablet (25 mg total) by mouth daily.    Dispense:  30 tablet    Refill:  3  . metoprolol succinate (TOPROL-XL) 50 MG 24 hr tablet    Sig: Take 1 tablet (50 mg total) by mouth daily.     Dispense:  30 tablet    Refill:  3  . montelukast (SINGULAIR) 10 MG tablet    Sig: Take 1 tablet (10 mg total) by mouth at bedtime.    Dispense:  30 tablet    Refill:  3  . atorvastatin (LIPITOR) 20 MG tablet    Sig: Take 1 tablet (20 mg total) by mouth daily.    Dispense:  30 tablet    Refill:  3  . meloxicam (MOBIC) 7.5 MG tablet    Sig: Take 1 tablet (7.5 mg total) by mouth daily. Prn pain    Dispense:  30 tablet    Refill:  1  . fluticasone furoate-vilanterol (BREO ELLIPTA) 200-25 MCG/INH AEPB    Sig: Inhale 1 puff into the lungs daily.  Dispense:  90 each    Refill:  3  . VENTOLIN HFA 108 (90 Base) MCG/ACT inhaler    Sig: Inhale 2 puffs into the lungs every 4 (four) hours as needed for wheezing or shortness of breath.    Dispense:  1 Inhaler    Refill:  3    Follow-up: Return in about 3 months (around 10/18/2017) for Follow-up of chronic conditions.   Arnoldo Morale MD

## 2017-07-20 ENCOUNTER — Other Ambulatory Visit: Payer: Self-pay

## 2017-07-21 ENCOUNTER — Ambulatory Visit: Payer: Self-pay | Attending: Family Medicine

## 2017-07-21 DIAGNOSIS — I10 Essential (primary) hypertension: Secondary | ICD-10-CM | POA: Insufficient documentation

## 2017-07-21 NOTE — Progress Notes (Signed)
Patient here for lab visit only 

## 2017-07-22 LAB — CMP14+EGFR
A/G RATIO: 1.2 (ref 1.2–2.2)
ALBUMIN: 4.1 g/dL (ref 3.5–5.5)
ALT: 18 IU/L (ref 0–32)
AST: 21 IU/L (ref 0–40)
Alkaline Phosphatase: 51 IU/L (ref 39–117)
BILIRUBIN TOTAL: 0.3 mg/dL (ref 0.0–1.2)
BUN / CREAT RATIO: 16 (ref 9–23)
BUN: 12 mg/dL (ref 6–24)
CALCIUM: 9.3 mg/dL (ref 8.7–10.2)
CHLORIDE: 103 mmol/L (ref 96–106)
CO2: 24 mmol/L (ref 20–29)
Creatinine, Ser: 0.73 mg/dL (ref 0.57–1.00)
GFR, EST AFRICAN AMERICAN: 110 mL/min/{1.73_m2} (ref >59)
GFR, EST NON AFRICAN AMERICAN: 95 mL/min/{1.73_m2} (ref >59)
GLOBULIN, TOTAL: 3.5 g/dL (ref 1.5–4.5)
Glucose: 125 mg/dL — ABNORMAL HIGH (ref 65–99)
POTASSIUM: 4.3 mmol/L (ref 3.5–5.2)
SODIUM: 143 mmol/L (ref 134–144)
TOTAL PROTEIN: 7.6 g/dL (ref 6.0–8.5)

## 2017-07-22 LAB — LIPID PANEL
CHOL/HDL RATIO: 2.4 ratio (ref 0.0–4.4)
Cholesterol, Total: 127 mg/dL (ref 100–199)
HDL: 53 mg/dL (ref >39)
LDL Calculated: 61 mg/dL (ref 0–99)
TRIGLYCERIDES: 67 mg/dL (ref 0–149)
VLDL CHOLESTEROL CAL: 13 mg/dL (ref 5–40)

## 2017-07-25 MED FILL — ?ATORVASTATIN 20 MG TABLET: 20 | 30 days supply | Qty: 30 | Fill #0

## 2017-08-04 ENCOUNTER — Telehealth: Payer: Self-pay

## 2017-08-04 NOTE — Telephone Encounter (Signed)
Pt was called and informed of lab results. 

## 2017-08-18 MED FILL — **BREO ELLIPTA 200-25 MCG I: 200-25 MCG | 30 days supply | Qty: 60 | Fill #4

## 2017-08-18 MED FILL — METOPROLOL SUCC ER 50 MG TA: 50 | 30 days supply | Qty: 30 | Fill #3

## 2017-08-18 MED FILL — ?SPIRONOLACTONE 25 MG TABLE: 25 | 30 days supply | Qty: 30 | Fill #3

## 2017-08-18 MED FILL — ?FUROSEMIDE 20 MG TABLET: 20 | 30 days supply | Qty: 30 | Fill #2

## 2017-08-18 MED FILL — MELOXICAM 7.5 MG TABLET: 7.5 | 30 days supply | Qty: 30 | Fill #2

## 2017-08-18 MED FILL — ?MONTELUKAST SOD 10MG TAB: 10 | 30 days supply | Qty: 30 | Fill #2

## 2017-08-24 ENCOUNTER — Telehealth: Payer: Self-pay | Admitting: Family Medicine

## 2017-08-24 MED FILL — ?ATORVASTATIN 20 MG TABLET: 20 | 30 days supply | Qty: 30 | Fill #1

## 2017-08-24 MED FILL — !VENTOLIN HFA INHALER: 108 (90 BAS | 16 days supply | Qty: 18 | Fill #0

## 2017-08-24 NOTE — Telephone Encounter (Signed)
Patient came in asking for and anabiotic for cold. Please FU with patient.

## 2017-08-25 NOTE — Telephone Encounter (Signed)
I would suggest using nasal decongestants, Tylenol and antitussives first and if symptoms do not respond she will need a visit.

## 2017-08-26 ENCOUNTER — Telehealth: Payer: Self-pay | Admitting: Family Medicine

## 2017-08-26 NOTE — Telephone Encounter (Signed)
Called Patient back and told her that the Dr. Venetia NightAmao recommended that take Tylenol

## 2017-09-15 MED FILL — METOPROLOL SUCC ER 50 MG TA: 50 | 30 days supply | Qty: 30 | Fill #0

## 2017-09-15 MED FILL — ?SPIRONOLACTONE 25 MG TABLE: 25 | 30 days supply | Qty: 30 | Fill #0

## 2017-09-16 ENCOUNTER — Encounter: Payer: Self-pay | Admitting: Pulmonary Disease

## 2017-09-16 ENCOUNTER — Ambulatory Visit (INDEPENDENT_AMBULATORY_CARE_PROVIDER_SITE_OTHER): Payer: Self-pay | Admitting: Pulmonary Disease

## 2017-09-16 VITALS — BP 118/80 | HR 74 | Ht 66.0 in | Wt 296.0 lb

## 2017-09-16 DIAGNOSIS — F32A Depression, unspecified: Secondary | ICD-10-CM

## 2017-09-16 DIAGNOSIS — F329 Major depressive disorder, single episode, unspecified: Secondary | ICD-10-CM

## 2017-09-16 DIAGNOSIS — G4733 Obstructive sleep apnea (adult) (pediatric): Secondary | ICD-10-CM

## 2017-09-16 DIAGNOSIS — Z6841 Body Mass Index (BMI) 40.0 and over, adult: Secondary | ICD-10-CM

## 2017-09-16 NOTE — Progress Notes (Signed)
Current Outpatient Medications on File Prior to Visit  Medication Sig  . atorvastatin (LIPITOR) 20 MG tablet Take 1 tablet (20 mg total) by mouth daily.  . fluticasone furoate-vilanterol (BREO ELLIPTA) 200-25 MCG/INH AEPB Inhale 1 puff into the lungs daily.  . meloxicam (MOBIC) 7.5 MG tablet Take 1 tablet (7.5 mg total) by mouth daily. Prn pain  . metoprolol succinate (TOPROL-XL) 50 MG 24 hr tablet Take 1 tablet (50 mg total) by mouth daily.  . montelukast (SINGULAIR) 10 MG tablet Take 1 tablet (10 mg total) by mouth at bedtime.  Marland Kitchen. spironolactone (ALDACTONE) 25 MG tablet Take 1 tablet (25 mg total) by mouth daily.  . VENTOLIN HFA 108 (90 Base) MCG/ACT inhaler Inhale 2 puffs into the lungs every 4 (four) hours as needed for wheezing or shortness of breath.   No current facility-administered medications on file prior to visit.      Chief Complaint  Patient presents with  . Sleep Consult    Pt referred by MW. Pt wakes up every 2 hours to get up use restroom. Pt has SOB, wheezing at night and during day with daytime sleepiness. Pt suffers with depression, tends to eat more pt states than she is more sleepy.      Sleep tests PSG 11/30/12 >> AHI 61.3 CT angio chest 05/30/16 >> atherosclerosis  Pulmonary tests PFT 09/20/14 >> FEV1 1.19 (54%), FEV1% 99, TLC 2.85 (59%), DLCO 63%  Past medical history HLD, HTN  Past surgical history, Family history, Social history, Allergies all reviewed.  Vital Signs BP 118/80 (BP Location: Left Arm, Cuff Size: Normal)   Pulse 74   Ht 5\' 6"  (1.676 m)   Wt 296 lb (134.3 kg)   SpO2 97%   BMI 47.78 kg/m   History of Present Illness Railynn M [name] is a 51 y.o. female former smoker with obstructive sleep apnea and asthma.  She had sleep study several years ago.  Found to have severe sleep apnea.  Uses CPAP.  Mask is too big.  Feels pressure is okay.  She is very depressed.  Feels like everyone hates her.  Can't keep a job.  Family makes fun of her.   She just feels like sleeping and eating.  The more she eats, the more weight she gains.  Then her breathing gets worse.    Physical Exam  General - No distress Eyes - pupils reactive ENT - No sinus tenderness, no oral exudate, no LAN Cardiac - s1s2 regular, no murmur Chest - No wheeze/rales/dullness Back - No focal tenderness Abd - Soft, non-tender Ext - No edema Neuro - Normal strength Skin - No rashes Psych - tearful   Assessment/Plan  Obstructive sleep apnea. - will get copy of her CPAP download and have her mask refitted  Depression. - will arrange for referral to behavioral health  Obesity. - discussed how her weight is impacting her breathing - reviewed options to assist with weight loss  Asthma. - continue breo and singulair with prn ventolin   Patient Instructions  Will arrange for referral to behavioral health  Will get copy of your CPAP report and get your CPAP mask refitted  Follow up in 2 months    Coralyn HellingVineet Cortnee Steinmiller, MD  Pulmonary/Critical Care/Sleep Pager:  [phone] 09/16/2017, 10:14 AM

## 2017-09-16 NOTE — Patient Instructions (Signed)
Will arrange for referral to behavioral health  Will get copy of your CPAP report and get your CPAP mask refitted  Follow up in 2 months

## 2017-09-16 NOTE — Progress Notes (Signed)
   Subjective:    Patient ID: Gail Jordan, female    DOB: 1966/05/18, 51 y.o.   MRN: [id number]  HPI    Review of Systems  Constitutional: Positive for unexpected weight change. Negative for fever.  HENT: Positive for congestion, postnasal drip and sinus pressure. Negative for dental problem, ear pain, nosebleeds, rhinorrhea, sneezing, sore throat and trouble swallowing.   Eyes: Negative for redness and itching.  Respiratory: Positive for chest tightness, shortness of breath and wheezing. Negative for cough.   Cardiovascular: Negative for palpitations and leg swelling.  Gastrointestinal: Negative for nausea and vomiting.  Genitourinary: Negative for dysuria.  Musculoskeletal: Negative for joint swelling.  Skin: Negative for rash.  Allergic/Immunologic: Negative.  Negative for environmental allergies, food allergies and immunocompromised state.  Neurological: Negative for headaches.  Hematological: Does not bruise/bleed easily.  Psychiatric/Behavioral: Negative for dysphoric mood. The patient is nervous/anxious.        Objective:   Physical Exam        Assessment & Plan:

## 2017-09-19 MED FILL — ?MONTELUKAST SOD 10MG TAB: 10 | 30 days supply | Qty: 30 | Fill #3

## 2017-09-19 MED FILL — !VENTOLIN HFA INHALER: 108 (90 BAS | 16 days supply | Qty: 18 | Fill #1

## 2017-09-19 MED FILL — ?ATORVASTATIN 20 MG TABLET: 20 | 30 days supply | Qty: 30 | Fill #2

## 2017-09-19 MED FILL — MELOXICAM 7.5 MG TABLET: 7.5 | 30 days supply | Qty: 30 | Fill #0

## 2017-09-19 MED FILL — !BREO ELLIPTA 200-25 MCG: 200-25 | 30 days supply | Qty: 60 | Fill #5

## 2017-09-20 ENCOUNTER — Telehealth: Payer: Self-pay | Admitting: Pulmonary Disease

## 2017-09-20 NOTE — Telephone Encounter (Signed)
Sp[facility] with Amy [facility] today regarding download on pt for her CPAP machine. Amy advised that pts machine is older and not enrolled into [employer]. That Amy is scheduling a mask fitting and download request with patient today for this week. Amy will fax over the download, will place in VS box when rec'd.

## 2017-09-28 ENCOUNTER — Ambulatory Visit: Payer: Self-pay | Attending: Family Medicine

## 2017-10-04 ENCOUNTER — Ambulatory Visit: Payer: Self-pay | Admitting: Pulmonary Disease

## 2017-10-04 ENCOUNTER — Telehealth: Payer: Self-pay | Admitting: Pulmonary Disease

## 2017-10-04 NOTE — Telephone Encounter (Addendum)
Pt came in for update on foster care forms. Per VS he would like for pt to have depression further evaluated before signing form, as pt appeared depressed at that OV. I have made pt aware of this information. Pt states she would like an OV with VS today to discuss further. Pt has been scheduled for OV today with VS, but has been made aware that she may have to wait as she is being fit into schedule.  Pt voiced her understanding and had no further questions. Nothing further needed.

## 2017-10-04 NOTE — Telephone Encounter (Signed)
UPDATE: Pt was scheduled at 10:30, pt left office without informing nurse or front staff.

## 2017-10-18 ENCOUNTER — Ambulatory Visit: Payer: Self-pay | Admitting: Family Medicine

## 2017-10-19 MED FILL — ?ATORVASTATIN 20 MG TABLET: 20 | 30 days supply | Qty: 30 | Fill #3

## 2017-10-19 MED FILL — METOPROLOL SUCC ER 50 MG TA: 50 | 30 days supply | Qty: 30 | Fill #1

## 2017-10-19 MED FILL — !VENTOLIN HFA INHALER: 108 (90 BAS | 16 days supply | Qty: 18 | Fill #2

## 2017-10-19 MED FILL — ?SPIRONOLACTONE 25 MG TABLE: 25 | 30 days supply | Qty: 30 | Fill #1

## 2017-10-19 MED FILL — MELOXICAM 7.5 MG TABLET: 7.5 | 30 days supply | Qty: 30 | Fill #1

## 2017-10-19 MED FILL — !BREO ELLIPTA 200-25 MCG: 200-25 | 2 days supply | Qty: 4 | Fill #6

## 2017-10-19 MED FILL — ?MONTELUKAST SOD 10MG TAB: 10 | 30 days supply | Qty: 30 | Fill #0

## 2017-10-27 ENCOUNTER — Other Ambulatory Visit: Payer: Self-pay | Admitting: Family Medicine

## 2017-10-27 DIAGNOSIS — I10 Essential (primary) hypertension: Secondary | ICD-10-CM

## 2017-10-28 ENCOUNTER — Ambulatory Visit: Payer: Self-pay | Attending: Family Medicine | Admitting: Family Medicine

## 2017-10-28 ENCOUNTER — Ambulatory Visit: Payer: Self-pay | Admitting: Licensed Clinical Social Worker

## 2017-10-28 ENCOUNTER — Encounter: Payer: Self-pay | Admitting: Family Medicine

## 2017-10-28 VITALS — BP 136/80 | HR 67 | Temp 97.6°F | Ht 66.0 in | Wt 298.0 lb

## 2017-10-28 DIAGNOSIS — Z79899 Other long term (current) drug therapy: Secondary | ICD-10-CM | POA: Insufficient documentation

## 2017-10-28 DIAGNOSIS — F3289 Other specified depressive episodes: Secondary | ICD-10-CM

## 2017-10-28 DIAGNOSIS — I10 Essential (primary) hypertension: Secondary | ICD-10-CM | POA: Insufficient documentation

## 2017-10-28 DIAGNOSIS — Z791 Long term (current) use of non-steroidal anti-inflammatories (NSAID): Secondary | ICD-10-CM | POA: Insufficient documentation

## 2017-10-28 DIAGNOSIS — F419 Anxiety disorder, unspecified: Secondary | ICD-10-CM | POA: Insufficient documentation

## 2017-10-28 DIAGNOSIS — E7800 Pure hypercholesterolemia, unspecified: Secondary | ICD-10-CM | POA: Insufficient documentation

## 2017-10-28 DIAGNOSIS — R5383 Other fatigue: Secondary | ICD-10-CM

## 2017-10-28 DIAGNOSIS — F329 Major depressive disorder, single episode, unspecified: Secondary | ICD-10-CM | POA: Insufficient documentation

## 2017-10-28 DIAGNOSIS — Z9114 Patient's other noncompliance with medication regimen: Secondary | ICD-10-CM | POA: Insufficient documentation

## 2017-10-28 DIAGNOSIS — E669 Obesity, unspecified: Secondary | ICD-10-CM | POA: Insufficient documentation

## 2017-10-28 DIAGNOSIS — Z888 Allergy status to other drugs, medicaments and biological substances status: Secondary | ICD-10-CM | POA: Insufficient documentation

## 2017-10-28 DIAGNOSIS — Z9119 Patient's noncompliance with other medical treatment and regimen: Secondary | ICD-10-CM | POA: Insufficient documentation

## 2017-10-28 DIAGNOSIS — J4520 Mild intermittent asthma, uncomplicated: Secondary | ICD-10-CM | POA: Insufficient documentation

## 2017-10-28 DIAGNOSIS — Z9889 Other specified postprocedural states: Secondary | ICD-10-CM | POA: Insufficient documentation

## 2017-10-28 DIAGNOSIS — G4700 Insomnia, unspecified: Secondary | ICD-10-CM | POA: Insufficient documentation

## 2017-10-28 DIAGNOSIS — G4733 Obstructive sleep apnea (adult) (pediatric): Secondary | ICD-10-CM | POA: Insufficient documentation

## 2017-10-28 MED ORDER — MONTELUKAST SODIUM 10 MG PO TABS: 10.0000 mg | ORAL_TABLET | Freq: Every day | ORAL | 3 refills | Status: DC

## 2017-10-28 MED ORDER — METOPROLOL SUCCINATE ER 50 MG PO TB24: 50.0000 mg | ORAL_TABLET | Freq: Every day | ORAL | 3 refills | Status: DC

## 2017-10-28 MED ORDER — IBUPROFEN 800 MG PO TABS: 800.0000 mg | ORAL_TABLET | Freq: Every day | ORAL | 3 refills | Status: DC | PRN

## 2017-10-28 MED ORDER — SPIRONOLACTONE 25 MG PO TABS: 25.0000 mg | ORAL_TABLET | Freq: Every day | ORAL | 3 refills | Status: DC

## 2017-10-28 MED ORDER — ATORVASTATIN CALCIUM 20 MG PO TABS: 20.0000 mg | ORAL_TABLET | Freq: Every day | ORAL | 3 refills | Status: DC

## 2017-10-28 MED ORDER — VENTOLIN HFA 108 (90 BASE) MCG/ACT IN AERS: 2.0000 | INHALATION_SPRAY | RESPIRATORY_TRACT | 3 refills | Status: DC | PRN

## 2017-10-28 MED ORDER — FLUTICASONE FUROATE-VILANTEROL 200-25 MCG/INH IN AEPB: 1.0000 | INHALATION_SPRAY | Freq: Every day | RESPIRATORY_TRACT | 3 refills | Status: DC

## 2017-10-28 MED FILL — ATORVASTATIN 20 MG TABLET: 20 | 30 days supply | Qty: 30 | Fill #0

## 2017-10-28 MED FILL — METOPROLOL SUCC ER 50 MG TA: 50 | 30 days supply | Qty: 30 | Fill #0

## 2017-10-28 MED FILL — SPIRONOLACTONE 25 MG TABLET: 25 | 30 days supply | Qty: 30 | Fill #0

## 2017-10-28 NOTE — BH Specialist Note (Signed)
Integrated Behavioral Health Initial Visit  MRN: [id number] Name: Gail Jordan WUJWJXBJGbadagba  Number of Integrated Behavioral Health Clinician visits:: 1/6 Session Start time: 9:50 AM  Session End time: 10:20 AM Total time: 30 minutes  Type of Service: Integrated Behavioral Health- Individual/Family Interpretor:No. Interpretor Name and Language: N/A   Warm Hand Off Completed.       SUBJECTIVE: Gail Judie Jordan [name] is a 51 y.o. female accompanied by self Patient was referred by Dr. Alvis LemmingsNewlin for depression and anxiety. Patient reports the following symptoms/concerns: difficulty sleeping, low energy, worrying about health, and financial strain Duration of problem: 3 months; Severity of problem: moderate  OBJECTIVE: Mood: Depressed and Affect: Tearful Risk of harm to self or others: No plan to harm self or others  LIFE CONTEXT: Family and Social: Pt resides with her adult daughter who pt has a strained relationship with. She has an adult son who she is close with.  School/Work: Pt stated that she is fired from jobs due to medical conditions. She is in the process of applying for disability Self-Care: Pt exercises at a local gym when she is able. No report of substance use Life Changes: Pt has difficulty managing medical conditions. She receives limited emotional and financial support, stating adult daughter does not believe that she is sick.   GOALS ADDRESSED: Patient will: 1. Reduce symptoms of: anxiety and depression 2. Increase knowledge and/or ability of: coping skills and healthy habits  3. Demonstrate ability to: Increase adequate support systems for patient/family  INTERVENTIONS: Interventions utilized: Solution-Focused Strategies, Supportive Counseling, Psychoeducation and/or Health Education and Link to [facility]  Standardized Assessments completed: GAD-7 and PHQ 2&9  ASSESSMENT: Patient currently experiencing depression triggered by medical condition, ongoing conflict with  adult daughter, and financial strain. She reports difficulty sleeping, low energy, and worrying about health.   Patient may benefit from psychoeducation and medication management. LCSWA educated pt on the correlation between one's physical and mental health, in addition, to how stress can negatively impact both. Pt was taught healthy interventions to decrease symptoms ( I.e. Deep breathing) Pt is interested in exercising more. LCSWA provided information on [facility] to strengthen support system and participate in free low impact fitness classes. Pt will schedule appointment with Financial Counseling to address financial strain.   PLAN: 1. Follow up with behavioral health clinician on : Pt was encouraged to contact LCSWA if symptoms worsen or fail to improve to [facility]. 2. Behavioral recommendations: LCSWA recommends that pt apply healthy coping skills discussed and utilize provided resources. Pt is encouraged to schedule follow up appointment with LCSWA 3. Referral([facility]): [facility]  4. "From scale of 1-10, how likely are you to follow plan?": 9/10  Bridgett LarssonJasmine D Lewis, LCSW 10/28/17 2:15 PM

## 2017-10-28 NOTE — Progress Notes (Signed)
Subjective:  Patient ID: Gail Jordan, female    DOB: May 31, 1966  Age: 51 y.o. MRN: [id number]  CC: Hypertension   HPI Gail Jordan is a 51 year old female with a history of obesity, hypertension, hyperlipidemia, obstructive sleep apnea, asthma who presents today for follow-up visit.  She has been compliant with her inhalers and reports control of her symptoms. Denies having to use her rescue inhaler more than usual.  She denies chest pains, wheezing or shortness of breath.  She has been compliant with her antihypertensive and her statin and is tolerating her medications with no adverse effect.  She is requesting refills. With regards to her sleep apnea she has not been compliant with her CPAP machine. Her last visit to pulmonary (Dr Craige Cotta) was in 09/2017 and no changes made in her management.  She endorses being depressed; "my daughter gives me a headache".  Complains of being ill treated by her children whom she raised single-handedly after her husband passed and complains that they do not support her or provide her financial assistance.  They sometimes turn off the power in her house. She does have insomnia and is usually tired all the time which as a result led to her being fired from her job and she is trying to apply for disability. She is not interested in medications and states she has people who will help her by praying for her. Denies suicidal ideation or intents.  Past Medical History:  Diagnosis Date  . High cholesterol   . Hyperlipidemia   . Hypertension   . Obesity   . Sleep apnea     Past Surgical History:  Procedure Laterality Date  . RETAINED PLACENTA REMOVAL     after child birth  . STOMACH SURGERY     possibly???    Allergies  Allergen Reactions  . Antihistamines, Diphenhydramine-Type Itching, Swelling and Other (See Comments)    Reaction:  Eye swelling      Outpatient Medications Prior to Visit  Medication Sig Dispense Refill  . atorvastatin  (LIPITOR) 20 MG tablet Take 1 tablet (20 mg total) by mouth daily. 30 tablet 3  . fluticasone furoate-vilanterol (BREO ELLIPTA) 200-25 MCG/INH AEPB Inhale 1 puff into the lungs daily. 90 each 3  . meloxicam (MOBIC) 7.5 MG tablet Take 1 tablet (7.5 mg total) by mouth daily. Prn pain 30 tablet 1  . metoprolol succinate (TOPROL-XL) 50 MG 24 hr tablet Take 1 tablet (50 mg total) by mouth daily. 30 tablet 3  . montelukast (SINGULAIR) 10 MG tablet Take 1 tablet (10 mg total) by mouth at bedtime. 30 tablet 3  . spironolactone (ALDACTONE) 25 MG tablet Take 1 tablet (25 mg total) by mouth daily. 30 tablet 3  . VENTOLIN HFA 108 (90 Base) MCG/ACT inhaler Inhale 2 puffs into the lungs every 4 (four) hours as needed for wheezing or shortness of breath. 1 Inhaler 3   No facility-administered medications prior to visit.     ROS Review of Systems  Constitutional: Negative for activity change, appetite change and fatigue.  HENT: Negative for congestion, sinus pressure and sore throat.   Eyes: Negative for visual disturbance.  Respiratory: Negative for cough, chest tightness, shortness of breath and wheezing.   Cardiovascular: Negative for chest pain and palpitations.  Gastrointestinal: Negative for abdominal distention, abdominal pain and constipation.  Endocrine: Negative for polydipsia.  Genitourinary: Negative for dysuria and frequency.  Musculoskeletal: Negative for arthralgias and back pain.  Skin: Negative for rash.  Neurological: Negative for  tremors, light-headedness and numbness.  Hematological: Does not bruise/bleed easily.  Psychiatric/Behavioral: Positive for dysphoric mood. Negative for agitation and behavioral problems.    Objective:  BP 136/80   Pulse 67   Temp 97.6 F (36.4 C) (Oral)   Ht 5\' 6"  (1.676 m)   Wt 298 lb (135.2 kg)   SpO2 98%   BMI 48.10 kg/m   BP/Weight 10/28/2017 09/16/2017 07/19/2017  Systolic BP 136 118 142  Diastolic BP 80 80 86  Wt. (Lbs) 298 296 297  BMI  48.1 47.78 47.94      Physical Exam  Constitutional: She is oriented to person, place, and time. She appears well-developed and well-nourished.  Cardiovascular: Normal rate, normal heart sounds and intact distal pulses.  No murmur heard. Pulmonary/Chest: Effort normal and breath sounds normal. She has no wheezes. She has no rales. She exhibits no tenderness.  Abdominal: Soft. Bowel sounds are normal. She exhibits no distension and no mass. There is no tenderness.  Musculoskeletal: Normal range of motion.  Neurological: She is alert and oriented to person, place, and time.  Skin: Skin is warm and dry.  Psychiatric:  Dysphoric mood     CMP Latest Ref Rng & Units 07/21/2017 03/04/2017 10/08/2016  Glucose 65 - 99 mg/dL 161(W125(H) 960(A188(H) 540(J116(H)  BUN 6 - 24 mg/dL 12 13 11   Creatinine 0.57 - 1.00 mg/dL 8.110.73 9.140.77 7.820.68  Sodium 134 - 144 mmol/L 143 140 140  Potassium 3.5 - 5.2 mmol/L 4.3 3.9 4.2  Chloride 96 - 106 mmol/L 103 106 103  CO2 20 - 29 mmol/L 24 27 30   Calcium 8.7 - 10.2 mg/dL 9.3 9.3 9.5  Total Protein 6.0 - 8.5 g/dL 7.6 - -  Total Bilirubin 0.0 - 1.2 mg/dL 0.3 - -  Alkaline Phos 39 - 117 IU/L 51 - -  AST 0 - 40 IU/L 21 - -  ALT 0 - 32 IU/L 18 - -    Lipid Panel     Component Value Date/Time   CHOL 127 07/21/2017 0839   TRIG 67 07/21/2017 0839   HDL 53 07/21/2017 0839   CHOLHDL 2.4 07/21/2017 0839   LDLCALC 61 07/21/2017 0839    Assessment & Plan:   1. Essential hypertension Controlled Low-sodium diet - atorvastatin (LIPITOR) 20 MG tablet; Take 1 tablet (20 mg total) by mouth daily.  Dispense: 30 tablet; Refill: 3 - metoprolol succinate (TOPROL-XL) 50 MG 24 hr tablet; Take 1 tablet (50 mg total) by mouth daily.  Dispense: 30 tablet; Refill: 3 - spironolactone (ALDACTONE) 25 MG tablet; Take 1 tablet (25 mg total) by mouth daily.  Dispense: 30 tablet; Refill: 3  2. Mild intermittent asthma without complication Stable No acute exacerbation - fluticasone  furoate-vilanterol (BREO ELLIPTA) 200-25 MCG/INH AEPB; Inhale 1 puff into the lungs daily.  Dispense: 90 each; Refill: 3 - montelukast (SINGULAIR) 10 MG tablet; Take 1 tablet (10 mg total) by mouth at bedtime.  Dispense: 30 tablet; Refill: 3 - VENTOLIN HFA 108 (90 Base) MCG/ACT inhaler; Inhale 2 puffs into the lungs every 4 (four) hours as needed for wheezing or shortness of breath.  Dispense: 1 Inhaler; Refill: 3  3. Other fatigue Could be secondary to depression versus fatigue from insomnia We will check for underlying causes - TSH - VITAMIN D 25 Hydroxy (Vit-D Deficiency, Fractures)  4. OSA (obstructive sleep apnea) Noncompliant with CPAP and compliance has been emphasized  5. Other depression Secondary to underlying social situations She adamantly declines initiation of antidepressant LCSW called  in for counseling   Meds ordered this encounter  Medications  . atorvastatin (LIPITOR) 20 MG tablet    Sig: Take 1 tablet (20 mg total) by mouth daily.    Dispense:  30 tablet    Refill:  3  . fluticasone furoate-vilanterol (BREO ELLIPTA) 200-25 MCG/INH AEPB    Sig: Inhale 1 puff into the lungs daily.    Dispense:  90 each    Refill:  3  . metoprolol succinate (TOPROL-XL) 50 MG 24 hr tablet    Sig: Take 1 tablet (50 mg total) by mouth daily.    Dispense:  30 tablet    Refill:  3  . montelukast (SINGULAIR) 10 MG tablet    Sig: Take 1 tablet (10 mg total) by mouth at bedtime.    Dispense:  30 tablet    Refill:  3  . spironolactone (ALDACTONE) 25 MG tablet    Sig: Take 1 tablet (25 mg total) by mouth daily.    Dispense:  30 tablet    Refill:  3  . VENTOLIN HFA 108 (90 Base) MCG/ACT inhaler    Sig: Inhale 2 puffs into the lungs every 4 (four) hours as needed for wheezing or shortness of breath.    Dispense:  1 Inhaler    Refill:  3  . ibuprofen (ADVIL,MOTRIN) 800 MG tablet    Sig: Take 1 tablet (800 mg total) by mouth daily as needed.    Dispense:  30 tablet    Refill:  3     Follow-up: Return in about 3 months (around 01/26/2018) for follow up on medical conditions.   Jaclyn ShaggyEnobong Amao MD

## 2017-10-28 NOTE — Patient Instructions (Signed)

## 2017-10-29 LAB — VITAMIN D 25 HYDROXY (VIT D DEFICIENCY, FRACTURES): VIT D 25 HYDROXY: 30.4 ng/mL (ref 30.0–100.0)

## 2017-10-29 LAB — TSH: TSH: 2.74 u[IU]/mL (ref 0.450–4.500)

## 2017-11-07 ENCOUNTER — Telehealth: Payer: Self-pay

## 2017-11-07 NOTE — Telephone Encounter (Signed)
Pt was called and informed of lab results. 

## 2017-11-16 ENCOUNTER — Encounter: Payer: Self-pay | Admitting: Pulmonary Disease

## 2017-11-16 ENCOUNTER — Ambulatory Visit (INDEPENDENT_AMBULATORY_CARE_PROVIDER_SITE_OTHER): Payer: Self-pay | Admitting: Pulmonary Disease

## 2017-11-16 VITALS — BP 138/84 | HR 74 | Ht 66.0 in | Wt 297.8 lb

## 2017-11-16 DIAGNOSIS — J45998 Other asthma: Secondary | ICD-10-CM

## 2017-11-16 DIAGNOSIS — G4733 Obstructive sleep apnea (adult) (pediatric): Secondary | ICD-10-CM

## 2017-11-16 DIAGNOSIS — Z6841 Body Mass Index (BMI) 40.0 and over, adult: Secondary | ICD-10-CM

## 2017-11-16 DIAGNOSIS — Z789 Other specified health status: Secondary | ICD-10-CM

## 2017-11-16 NOTE — Progress Notes (Signed)
Gail Jordan, Gail Jordan, Gail Jordan  Chief Complaint  Patient presents with  . Follow-up    Pt states doing well with cpap machine. Pt is in need of different mask for cpap machine    Vital signs: BP 138/84 (BP Location: Left Arm, Cuff Size: Normal)   Pulse 74   Ht 5\' 6"  (1.676 m)   Wt 297 lb 12.8 oz (135.1 kg)   SpO2 100%   BMI 48.07 kg/m   History of Present Illness: Gail Jordan is a 52 y.o. female OSA, asthma.  She has been trying to use CPAP.  This helps.  She feels her mask is too big Gail not comfortable.  This makes it hard for her to use CPAP.    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  Her breathing would get worse.  She would get cough Gail wheeze when exposed to cleaning supplies.   Physical Exam:  General - pleasant Eyes - pupils reactive ENT - no sinus tenderness, no oral exudate, no LAN Cardiac - regular, no murmur Chest - no wheeze, rales Abd - soft, non tender Ext - no edema Skin - no rashes Neuro - normal strength Psych - normal mood   Assessment/Plan:  Obstructive sleep apnea. - she reports benefit from therapy - continue Auto CPAP  Difficulty using full face mask. - will try changing her to nasal pillows mask  Persistent asthma. - continue breo Gail prn albuterol - continue singulair - she is applying for disability  Obesity. - discussed importance of weight loss   Patient Instructions  Will try changing your CPAP mask  Follow up in 6 months    Coralyn HellingVineet Phoenix Riesen, MD [facility]/Gail Jordan 11/16/2017, 12:54 PM Pager:  [phone]  Flow Sheet  Sleep tests: PSG 11/30/12 >> AHI 61.3 CT angio chest 05/30/16 >> atherosclerosis  Jordan tests PFT 09/20/14 >> FEV1 1.19 (54%), FEV1% 99, TLC 2.85 (59%), DLCO 63%  Past Medical History: She  has a past medical history of High cholesterol, Hyperlipidemia, Hypertension, Obesity, Gail Sleep apnea.  Past Surgical History: She  has a  past surgical history that includes Retained placenta removal Gail Stomach surgery.  Family History: Her family history includes Hypertension in her father Gail sister.  Social History: She  reports that she quit smoking about 7 years ago. Her smoking use included cigarettes. She quit after 2.00 years of use. she has never used smokeless tobacco. She reports that she drinks alcohol. She reports that she does not use drugs.  Medications: Allergies as of 11/16/2017      Reactions   Antihistamines, Diphenhydramine-type Itching, Swelling, Other (See Comments)   Reaction:  Eye swelling       Medication List        Accurate as of 11/16/17 12:54 PM. Always use your most recent med list.          atorvastatin 20 MG tablet Commonly known as:  LIPITOR Take 1 tablet (20 mg total) by mouth daily.   fluticasone furoate-vilanterol 200-25 MCG/INH Aepb Commonly known as:  BREO ELLIPTA Inhale 1 puff into the lungs daily.   ibuprofen 800 MG tablet Commonly known as:  ADVIL,MOTRIN Take 1 tablet (800 mg total) by mouth daily as needed.   metoprolol succinate 50 MG 24 hr tablet Commonly known as:  TOPROL-XL Take 1 tablet (50 mg total) by mouth daily.   montelukast 10 MG tablet Commonly known as:  SINGULAIR Take 1 tablet (10 mg total)  by mouth at bedtime.   spironolactone 25 MG tablet Commonly known as:  ALDACTONE Take 1 tablet (25 mg total) by mouth daily.   VENTOLIN HFA 108 (90 Base) MCG/ACT inhaler Generic drug:  albuterol Inhale 2 puffs into the lungs every 4 (four) hours as needed for wheezing or shortness of breath.

## 2017-11-16 NOTE — Patient Instructions (Signed)
Will try changing your CPAP mask  Follow up in 6 months

## 2017-11-27 ENCOUNTER — Encounter (HOSPITAL_COMMUNITY): Payer: Self-pay | Admitting: Emergency Medicine

## 2017-11-27 ENCOUNTER — Emergency Department (HOSPITAL_COMMUNITY)
Admission: EM | Admit: 2017-11-27 | Discharge: 2017-11-27 | Disposition: A | Discharge status: Home / Self Care | Payer: Self-pay | Attending: Emergency Medicine | Admitting: Emergency Medicine

## 2017-11-27 ENCOUNTER — Emergency Department (HOSPITAL_COMMUNITY): Payer: Self-pay

## 2017-11-27 DIAGNOSIS — F430 Acute stress reaction: Secondary | ICD-10-CM | POA: Insufficient documentation

## 2017-11-27 DIAGNOSIS — R61 Generalized hyperhidrosis: Secondary | ICD-10-CM | POA: Insufficient documentation

## 2017-11-27 DIAGNOSIS — F411 Generalized anxiety disorder: Secondary | ICD-10-CM | POA: Insufficient documentation

## 2017-11-27 DIAGNOSIS — I10 Essential (primary) hypertension: Secondary | ICD-10-CM | POA: Insufficient documentation

## 2017-11-27 DIAGNOSIS — J45909 Unspecified asthma, uncomplicated: Secondary | ICD-10-CM | POA: Insufficient documentation

## 2017-11-27 DIAGNOSIS — R0602 Shortness of breath: Secondary | ICD-10-CM | POA: Insufficient documentation

## 2017-11-27 DIAGNOSIS — R079 Chest pain, unspecified: Secondary | ICD-10-CM | POA: Insufficient documentation

## 2017-11-27 DIAGNOSIS — R509 Fever, unspecified: Secondary | ICD-10-CM | POA: Insufficient documentation

## 2017-11-27 DIAGNOSIS — Z87891 Personal history of nicotine dependence: Secondary | ICD-10-CM | POA: Insufficient documentation

## 2017-11-27 DIAGNOSIS — Z79899 Other long term (current) drug therapy: Secondary | ICD-10-CM | POA: Insufficient documentation

## 2017-11-27 LAB — COMPREHENSIVE METABOLIC PANEL
ALT: 16 U/L (ref 14–54)
AST: 23 U/L (ref 15–41)
Albumin: 3.5 g/dL (ref 3.5–5.0)
Alkaline Phosphatase: 45 U/L (ref 38–126)
Anion gap: 10 (ref 5–15)
BILIRUBIN TOTAL: 0.5 mg/dL (ref 0.3–1.2)
BUN: 14 mg/dL (ref 6–20)
CALCIUM: 9 mg/dL (ref 8.9–10.3)
CO2: 22 mmol/L (ref 22–32)
CREATININE: 0.91 mg/dL (ref 0.44–1.00)
Chloride: 105 mmol/L (ref 101–111)
Glucose, Bld: 182 mg/dL — ABNORMAL HIGH (ref 65–99)
Potassium: 4.3 mmol/L (ref 3.5–5.1)
Sodium: 137 mmol/L (ref 135–145)
TOTAL PROTEIN: 7.5 g/dL (ref 6.5–8.1)

## 2017-11-27 LAB — CBC WITH DIFFERENTIAL/PLATELET
Basophils Absolute: 0 10*3/uL (ref 0.0–0.1)
Basophils Relative: 0 %
Eosinophils Absolute: 0.1 10*3/uL (ref 0.0–0.7)
Eosinophils Relative: 1 %
HCT: 38.3 % (ref 36.0–46.0)
Hemoglobin: 12.5 g/dL (ref 12.0–15.0)
LYMPHS ABS: 2.5 10*3/uL (ref 0.7–4.0)
Lymphocytes Relative: 36 %
MCH: 28.3 pg (ref 26.0–34.0)
MCHC: 32.6 g/dL (ref 30.0–36.0)
MCV: 86.8 fL (ref 78.0–100.0)
MONOS PCT: 7 %
Monocytes Absolute: 0.5 10*3/uL (ref 0.1–1.0)
Neutro Abs: 3.8 10*3/uL (ref 1.7–7.7)
Neutrophils Relative %: 56 %
Platelets: 224 10*3/uL (ref 150–400)
RBC: 4.41 MIL/uL (ref 3.87–5.11)
RDW: 14.3 % (ref 11.5–15.5)
WBC: 6.9 10*3/uL (ref 4.0–10.5)

## 2017-11-27 LAB — LIPASE, BLOOD: Lipase: 32 U/L (ref 11–51)

## 2017-11-27 LAB — I-STAT TROPONIN, ED: Troponin i, poc: 0 ng/mL (ref 0.00–0.08)

## 2017-11-27 MED ORDER — LORAZEPAM 0.5 MG PO TABS: 0.5000 mg | ORAL_TABLET | Freq: Three times a day (TID) | ORAL | 0 refills | Status: DC | PRN

## 2017-11-27 MED ORDER — LORAZEPAM 1 MG PO TABS
1.0000 mg | ORAL_TABLET | Freq: Once | ORAL | Status: AC
  Administered 2017-11-27: 1 mg via ORAL
  Filled 2017-11-27: qty 1

## 2017-11-27 NOTE — ED Provider Notes (Signed)
[facility] EMERGENCY DEPARTMENT Provider Note   CSN: [id number] Arrival date & time: 11/27/17  1752     History   Chief Complaint Chief Complaint  Patient presents with  . Shortness of Breath    HPI Gail Jordan is a 52 y.o. female.  Patient with a history of severe OSA, asthma, morbid obesity, DOE, HTN, HLD presents with complaint of chest pain and SOB. This is an ongoing issue but seems to be worse today. She reports subjective fever described as cold sweat, pain across her chest, elevated breathing rate. She has an inhaler at home but does not feel this helps. She uses her CPAP at night and states when her breathing rate is so high she wears her mask but this does not help. She states she has been going to the gym regularly and denies symptoms while exercising. 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 She also reports a history of panic attacks.   The history is provided by the patient. No language interpreter was used.    Past Medical History:  Diagnosis Date  . High cholesterol   . Hyperlipidemia   . Hypertension   . Obesity   . Sleep apnea     Patient Active Problem List   Diagnosis Date Noted  . Depression 10/28/2017  . 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 Chronic rhinitis 10/08/2016  . Asthma 07/05/2016  . Morbid (severe) obesity due to excess calories (HCC) 03/09/2016  . DOE (dyspnea on exertion) 09/03/2014  . Hypertension 01/04/2013  . Dyslipidemia 01/04/2013  . GERD (gastroesophageal reflux disease) 01/04/2013  . OSA (obstructive sleep apnea) 10/27/2012    Past Surgical History:  Procedure Laterality Date  . RETAINED PLACENTA REMOVAL     after child birth  . STOMACH SURGERY     possibly???    OB History    Gravida Para Term Preterm AB Living   5 2 2  0 3 2   SAB TAB Ectopic Multiple Live Births     0 3 0 0         Home Medications    Prior to Admission medications   Medication Sig Start Date End Date Taking? Authorizing Provider  atorvastatin (LIPITOR) 20 MG tablet Take 1 tablet (20 mg total) by mouth daily. 10/28/17   Jaclyn Shaggy, MD  fluticasone furoate-vilanterol (BREO ELLIPTA) 200-25 MCG/INH AEPB Inhale 1 puff into the lungs daily. 10/28/17   Jaclyn Shaggy, MD  ibuprofen (ADVIL,MOTRIN) 800 MG tablet Take 1 tablet (800 mg total) by mouth daily as needed. 10/28/17   Jaclyn Shaggy, MD  metoprolol succinate (TOPROL-XL) 50 MG 24 hr tablet Take 1 tablet (50 mg total) by mouth daily. 10/28/17   Jaclyn Shaggy, MD  montelukast (SINGULAIR) 10 MG tablet Take 1 tablet (10 mg total) by mouth at bedtime. 10/28/17   Jaclyn Shaggy, MD  spironolactone (ALDACTONE) 25 MG tablet Take 1 tablet (25 mg total) by mouth daily. 10/28/17   Jaclyn Shaggy, MD  VENTOLIN HFA 108 (90 Base) MCG/ACT inhaler Inhale 2 puffs into the lungs every 4 (four) hours as needed for wheezing or shortness of breath. 10/28/17   Jaclyn Shaggy, MD    Family History Family History  Problem Relation Age of Onset  . Hypertension Father   . Hypertension Sister     Social History Social History   Tobacco Use  .  Smoking status: Former Smoker    Years: 2.00    Types: Cigarettes    Last attempt to quit: 11/08/2010    Years since quitting: 7.0  . Smokeless tobacco: Never Used  . Tobacco comment: 1 pack per 1-2 weeks.   Substance Use Topics  . Alcohol use: Yes    Comment: once a month--wine  . Drug use: No     Allergies   Antihistamines, diphenhydramine-type   Review of Systems Review of Systems  Constitutional: Positive for fever (Subjective). Negative for chills.  HENT: Negative.  Negative for congestion.   Respiratory: Positive for shortness of breath. Negative for cough.   Cardiovascular: Positive for chest pain. Negative for palpitations.  Gastrointestinal: Negative.  Negative for abdominal pain, nausea  and vomiting.  Musculoskeletal: Negative.   Skin: Negative.   Neurological: Negative.   Psychiatric/Behavioral: Positive for dysphoric mood. The patient is nervous/anxious.      Physical Exam Updated Vital Signs BP (!) 143/84   Pulse 77   Temp 98.9 F (37.2 C) (Oral)   Resp 18   SpO2 95%   Physical Exam  Constitutional: She is oriented to person, place, and time. She appears well-developed and well-nourished. No distress.  HENT:  Head: Normocephalic.  Neck: Normal range of motion. Neck supple.  Cardiovascular: Normal rate and regular rhythm.  Pulmonary/Chest: Effort normal and breath sounds normal. She has no wheezes. She has no rales.  Chest wall tenderness bilaterally.  Abdominal: Soft. Bowel sounds are normal. There is no tenderness. There is no rebound and no guarding.  Musculoskeletal: Normal range of motion.  Neurological: She is alert and oriented to person, place, and time.  Skin: Skin is warm and dry. No rash noted.  Psychiatric: She has a normal mood and affect.  Nursing note and vitals reviewed.    ED Treatments / Results  Labs (all labs ordered are listed, but only abnormal results are displayed) Labs Reviewed  COMPREHENSIVE METABOLIC PANEL - Abnormal; Notable for the following components:      Result Value   Glucose, Bld 182 (*)    All other components within normal limits  CBC WITH DIFFERENTIAL/PLATELET  LIPASE, BLOOD  I-STAT TROPONIN, ED    EKG  EKG Interpretation  Date/Time:  Sunday November 27 2017 18:01:25 EST Ventricular Rate:  81 PR Interval:  178 QRS Duration: 86 QT Interval:  380 QTC Calculation: 441 R Axis:   1 Text Interpretation:  Normal sinus rhythm normal. no change from previous Confirmed by Arby Barrette [phone]) on 11/27/2017 8:31:34 PM       Radiology Dg Chest 2 View  Result Date: 11/27/2017 CLINICAL DATA:  Shortness of breath. EXAM: CHEST  2 VIEW COMPARISON:  March 04, 2017 FINDINGS: The heart size and mediastinal  contours are within normal limits. Both lungs are clear. The visualized skeletal structures are unremarkable. IMPRESSION: No active cardiopulmonary disease. Electronically Signed   By: Gerome Sam III M.D   On: 11/27/2017 19:00    Procedures Procedures (including critical care time)  Medications Ordered in ED Medications - No data to display   Initial Impression / Assessment and Plan / ED Course  I have reviewed the triage vital signs and the nursing notes.  Pertinent labs & imaging results that were available during my care of the patient were reviewed by me and considered in my medical decision making (see chart for details).     Patient presents with SoB and chest pain that was worse tonight. She is followed by  Dr. Sherene SiresWert for same and uses BREO inhaler daily. She wears a CPAP at night. No fever or cough.  Labs are reassuring. EKG without ischemic change. Negative troponin. She appears comfortable and vital signs have been stable. She reports history of panic/anxiety and becomes tearful when she discusses her family situation. Feel strongly that stress is contributory. Ativan provided.   Discussed with Dr. Donnald GarrePfeiffer. She is felt appropriate for discharge home. Will provide #5 Ativan for home use and encourage close PCP follow up.  Final Clinical Impressions(s) / ED Diagnoses   Final diagnoses:  None   1. Nonspecific chest pain 2. SOB 3. Anxiety and stress   ED Discharge Orders    None       Danne HarborUpstill, Joseguadalupe Stan, PA-C 11/27/17 2144    Arby BarrettePfeiffer, Marcy, MD 11/27/17 2359

## 2017-11-27 NOTE — ED Triage Notes (Signed)
Pt arrives with EMS for shortness of breath/adsthma. c.o. Shortness of breath since yesterday. Breath sounds clear. Used inhaler at home with relief. Also states nausea and stomach pain.

## 2017-11-27 NOTE — Discharge Instructions (Signed)
Call to make an appointment with your doctor as soon as can be scheduled for recheck of symptoms. Return here as needed.

## 2017-11-27 NOTE — ED Notes (Signed)
Pt discharged from ED; instructions provided; Pt encouraged to return to ED if symptoms worsen and to f/u with PCP; Pt verbalized understanding of all instructions 

## 2017-11-30 MED FILL — !BREO ELLIPTA 200-25 MCG: 200-25 | 30 days supply | Qty: 60 | Fill #0

## 2017-11-30 MED FILL — METOPROLOL SUCCINATE ER 50: 50 | 30 days supply | Qty: 30 | Fill #1

## 2017-11-30 MED FILL — SPIRONOLACTONE 25 MG TABS: 25 | 30 days supply | Qty: 30 | Fill #1

## 2017-11-30 MED FILL — !VENTOLIN HFA INHALER: 108 (90 BAS | 16 days supply | Qty: 18 | Fill #3

## 2017-11-30 MED FILL — ?ATORVASTATIN 20MG TABLET: 20 | 30 days supply | Qty: 30 | Fill #1

## 2017-12-06 ENCOUNTER — Other Ambulatory Visit: Payer: Self-pay | Admitting: Family Medicine

## 2017-12-19 ENCOUNTER — Inpatient Hospital Stay: Payer: Self-pay | Admitting: Family Medicine

## 2017-12-21 ENCOUNTER — Ambulatory Visit: Payer: Self-pay | Attending: Family Medicine | Admitting: Family Medicine

## 2017-12-21 ENCOUNTER — Encounter: Payer: Self-pay | Admitting: Family Medicine

## 2017-12-21 VITALS — BP 129/74 | HR 70 | Temp 97.6°F | Ht 66.0 in | Wt 299.0 lb

## 2017-12-21 DIAGNOSIS — F329 Major depressive disorder, single episode, unspecified: Secondary | ICD-10-CM | POA: Insufficient documentation

## 2017-12-21 DIAGNOSIS — J4520 Mild intermittent asthma, uncomplicated: Secondary | ICD-10-CM | POA: Insufficient documentation

## 2017-12-21 DIAGNOSIS — G4733 Obstructive sleep apnea (adult) (pediatric): Secondary | ICD-10-CM | POA: Insufficient documentation

## 2017-12-21 DIAGNOSIS — F32A Depression, unspecified: Secondary | ICD-10-CM

## 2017-12-21 DIAGNOSIS — F419 Anxiety disorder, unspecified: Secondary | ICD-10-CM | POA: Insufficient documentation

## 2017-12-21 DIAGNOSIS — I10 Essential (primary) hypertension: Secondary | ICD-10-CM | POA: Insufficient documentation

## 2017-12-21 MED ORDER — HYDROXYZINE HCL 25 MG PO TABS: 25.0000 mg | ORAL_TABLET | Freq: Three times a day (TID) | ORAL | 2 refills | Status: AC | PRN

## 2017-12-21 MED ORDER — VENTOLIN HFA 108 (90 BASE) MCG/ACT IN AERS: 2.0000 | INHALATION_SPRAY | RESPIRATORY_TRACT | 3 refills | Status: AC | PRN

## 2017-12-21 MED ORDER — MONTELUKAST SODIUM 10 MG PO TABS: 10.0000 mg | ORAL_TABLET | Freq: Every day | ORAL | 3 refills | Status: AC

## 2017-12-21 MED ORDER — FLUTICASONE FUROATE-VILANTEROL 200-25 MCG/INH IN AEPB: 1.0000 | INHALATION_SPRAY | Freq: Every day | RESPIRATORY_TRACT | 3 refills | Status: DC

## 2017-12-21 MED ORDER — ATORVASTATIN CALCIUM 20 MG PO TABS: 20.0000 mg | ORAL_TABLET | Freq: Every day | ORAL | 3 refills | Status: DC

## 2017-12-21 MED ORDER — METOPROLOL SUCCINATE ER 50 MG PO TB24: 50.0000 mg | ORAL_TABLET | Freq: Every day | ORAL | 3 refills | Status: DC

## 2017-12-21 MED ORDER — IBUPROFEN 800 MG PO TABS: 800.0000 mg | ORAL_TABLET | Freq: Every day | ORAL | 3 refills | Status: AC | PRN

## 2017-12-21 MED FILL — IBUPROFEN 800 MG TABLET: 800 | 30 days supply | Qty: 30 | Fill #0

## 2017-12-21 MED FILL — hydrOXYzine HCL 25 MG TABS: 25 | 30 days supply | Qty: 90 | Fill #0

## 2017-12-21 MED FILL — ?MONTELUKAST SOD 10 MG TAB: 10 | 30 days supply | Qty: 30 | Fill #0

## 2017-12-21 MED FILL — !VENTOLIN HFA INHALER: 108 (90 BAS | 16 days supply | Qty: 18 | Fill #0

## 2017-12-21 NOTE — Progress Notes (Signed)
Subjective:  Patient ID: Gail Jordan, female    DOB: 07-25-1966  Age: 52 y.o. MRN: [id number]  CC: Hospitalization Follow-up and Shortness of Breath   HPI Gail Jordan is a 52 year old female with a history of obesity, hypertension, hyperlipidemia, obstructive sleep apnea, asthma who presents today for a follow-up visit from the ED where she was seen on 11/27/2017 for shortness of breath.  Diagnostics were unrevealing and symptoms improved with Ativan.  She was subsequently prescribed Ativan tablets and then discharged.  She presents today seeming to be in better spirits than at her last visit.  She has been speaking with her pastor and has joined a gym. At her last visit she was seen by the LCSW and had  declined initiation of medications for anxiety and depression which has stemmed from her inability to keep a job due to worsening shortness of breath and asthma (she previously worked as a Advertising copywriter) and also stressors from her kids. She has been speaking with her Renato Gails who has been her major social support and she feels a lot better.  She attributes her symptoms to anxiety which starts with increased heart rate and is followed by chest pains and dyspnea; she is open to using an as needed medication today.  Past Medical History:  Diagnosis Date  . High cholesterol   . Hyperlipidemia   . Hypertension   . Obesity   . Sleep apnea    Past Surgical History:  Procedure Laterality Date  . RETAINED PLACENTA REMOVAL     after child birth  . STOMACH SURGERY     possibly???    Allergies  Allergen Reactions  . Antihistamines, Diphenhydramine-Type Itching, Swelling and Other (See Comments)    Reaction:  Eye swelling      Outpatient Medications Prior to Visit  Medication Sig Dispense Refill  . spironolactone (ALDACTONE) 25 MG tablet Take 1 tablet (25 mg total) by mouth daily. 30 tablet 3  . atorvastatin (LIPITOR) 20 MG tablet Take 1 tablet (20 mg total) by mouth daily. 30  tablet 3  . fluticasone furoate-vilanterol (BREO ELLIPTA) 200-25 MCG/INH AEPB Inhale 1 puff into the lungs daily. 90 each 3  . ibuprofen (ADVIL,MOTRIN) 800 MG tablet Take 1 tablet (800 mg total) by mouth daily as needed. 30 tablet 3  . LORazepam (ATIVAN) 0.5 MG tablet Take 1 tablet (0.5 mg total) by mouth 3 (three) times daily as needed for anxiety. 5 tablet 0  . metoprolol succinate (TOPROL-XL) 50 MG 24 hr tablet Take 1 tablet (50 mg total) by mouth daily. 30 tablet 3  . montelukast (SINGULAIR) 10 MG tablet Take 1 tablet (10 mg total) by mouth at bedtime. 30 tablet 3  . VENTOLIN HFA 108 (90 Base) MCG/ACT inhaler Inhale 2 puffs into the lungs every 4 (four) hours as needed for wheezing or shortness of breath. 1 Inhaler 3   No facility-administered medications prior to visit.     ROS Review of Systems  Constitutional: Negative for activity change, appetite change and fatigue.  HENT: Negative for congestion, sinus pressure and sore throat.   Eyes: Negative for visual disturbance.  Respiratory: Negative for cough, chest tightness, shortness of breath and wheezing.   Cardiovascular: Negative for chest pain and palpitations.  Gastrointestinal: Negative for abdominal distention, abdominal pain and constipation.  Endocrine: Negative for polydipsia.  Genitourinary: Negative for dysuria and frequency.  Musculoskeletal: Negative for arthralgias and back pain.  Skin: Negative for rash.  Neurological: Negative for tremors, light-headedness and numbness.  Hematological: Does not bruise/bleed easily.  Psychiatric/Behavioral: Negative for agitation and behavioral problems.    Objective:  BP 129/74   Pulse 70   Temp 97.6 F (36.4 C) (Oral)   Ht 5\' 6"  (1.676 m)   Wt 299 lb (135.6 kg)   SpO2 97%   BMI 48.26 kg/m   BP/Weight 12/21/2017 11/27/2017 11/16/2017  Systolic BP 129 144 138  Diastolic BP 74 83 84  Wt. (Lbs) 299 - 297.8  BMI 48.26 - 48.07      Physical Exam  Constitutional: She is  oriented to person, place, and time. She appears well-developed and well-nourished.  Morbidly obese  Cardiovascular: Normal rate, normal heart sounds and intact distal pulses.  No murmur heard. Pulmonary/Chest: Effort normal and breath sounds normal. She has no wheezes. She has no rales. She exhibits no tenderness.  Abdominal: Soft. Bowel sounds are normal. She exhibits no distension and no mass. There is no tenderness.  Musculoskeletal: Normal range of motion.  Neurological: She is alert and oriented to person, place, and time.  Skin: Skin is warm and dry.  Psychiatric: She has a normal mood and affect.     Assessment & Plan:   1. Mild intermittent asthma without complication Stable Her dyspnea has improved and she is working on losing weight by going to the gym which has been helpful for her - VENTOLIN HFA 108 (90 Base) MCG/ACT inhaler; Inhale 2 puffs into the lungs every 4 (four) hours as needed for wheezing or shortness of breath.  Dispense: 1 Inhaler; Refill: 3 - montelukast (SINGULAIR) 10 MG tablet; Take 1 tablet (10 mg total) by mouth at bedtime.  Dispense: 30 tablet; Refill: 3 - fluticasone furoate-vilanterol (BREO ELLIPTA) 200-25 MCG/INH AEPB; Inhale 1 puff into the lungs daily.  Dispense: 90 each; Refill: 3  2. OSA (obstructive sleep apnea) Compliance has been affected by problems with her mask She is in the process of having her mask changed  3. Anxiety and depression Seems to be in better spirits today She declines medications for depression but is willing to try hydroxyzine which she would use as needed Currently has a social support - pastor - hydrOXYzine (ATARAX/VISTARIL) 25 MG tablet; Take 1 tablet (25 mg total) by mouth 3 (three) times daily as needed.  Dispense: 90 tablet; Refill: 2  4. Essential hypertension Stable - metoprolol succinate (TOPROL-XL) 50 MG 24 hr tablet; Take 1 tablet (50 mg total) by mouth daily.  Dispense: 30 tablet; Refill: 3 - atorvastatin  (LIPITOR) 20 MG tablet; Take 1 tablet (20 mg total) by mouth daily.  Dispense: 30 tablet; Refill: 3   Meds ordered this encounter  Medications  . hydrOXYzine (ATARAX/VISTARIL) 25 MG tablet    Sig: Take 1 tablet (25 mg total) by mouth 3 (three) times daily as needed.    Dispense:  90 tablet    Refill:  2  . VENTOLIN HFA 108 (90 Base) MCG/ACT inhaler    Sig: Inhale 2 puffs into the lungs every 4 (four) hours as needed for wheezing or shortness of breath.    Dispense:  1 Inhaler    Refill:  3  . montelukast (SINGULAIR) 10 MG tablet    Sig: Take 1 tablet (10 mg total) by mouth at bedtime.    Dispense:  30 tablet    Refill:  3  . metoprolol succinate (TOPROL-XL) 50 MG 24 hr tablet    Sig: Take 1 tablet (50 mg total) by mouth daily.    Dispense:  30  tablet    Refill:  3  . ibuprofen (ADVIL,MOTRIN) 800 MG tablet    Sig: Take 1 tablet (800 mg total) by mouth daily as needed.    Dispense:  30 tablet    Refill:  3  . fluticasone furoate-vilanterol (BREO ELLIPTA) 200-25 MCG/INH AEPB    Sig: Inhale 1 puff into the lungs daily.    Dispense:  90 each    Refill:  3  . atorvastatin (LIPITOR) 20 MG tablet    Sig: Take 1 tablet (20 mg total) by mouth daily.    Dispense:  30 tablet    Refill:  3    Follow-up: Return in about 3 months (around 03/20/2018) for Follow-up of chronic medical conditions.   Hoy Register MD

## 2017-12-22 ENCOUNTER — Inpatient Hospital Stay: Payer: Self-pay | Admitting: Family Medicine

## 2017-12-27 ENCOUNTER — Other Ambulatory Visit: Payer: Self-pay | Admitting: Family Medicine

## 2017-12-27 MED FILL — !BREO ELLIPTA 200-25 MCG: 200-25 | 30 days supply | Qty: 60 | Fill #1 | Status: TO

## 2017-12-27 MED FILL — ?SPIR0N0LACTONE 25 MG TABLE: 25 | 30 days supply | Qty: 30 | Fill #2 | Status: TO

## 2017-12-27 MED FILL — METOPROLOL SUCCINATE ER 50: 50 | 30 days supply | Qty: 30 | Fill #2 | Status: TO

## 2017-12-27 MED FILL — ATORVASTATIN 20 MG TABLET: 20 | 30 days supply | Qty: 30 | Fill #2 | Status: TO

## 2017-12-29 ENCOUNTER — Telehealth: Payer: Self-pay | Admitting: Family Medicine

## 2017-12-29 NOTE — Telephone Encounter (Signed)
wnts call on the status of her CAF/OC/Blue card please follow up

## 2018-01-14 IMAGING — CT CT ANGIO CHEST
2 of 7 series · 17 of 36 positions shown · IV contrast (Omni 300)
Comparison: 05/30/2016

CLINICAL DATA: 50-year-old female with chest discomfort

EXAM:
CT ANGIOGRAPHY CHEST WITH CONTRAST
TECHNIQUE: Multidetector CT imaging of the chest was performed using the
standard protocol during bolus administration of intravenous
contrast. Multiplanar CT image reconstructions and MIPs were
obtained to evaluate the vascular anatomy.
CONTRAST:  100 cc Isovue 370

[Series 6: pe thins · axial · 0.73mm/px · z∈[-269,-44]mm · 16 of 257 slices shown]
[im 16/257  lung]
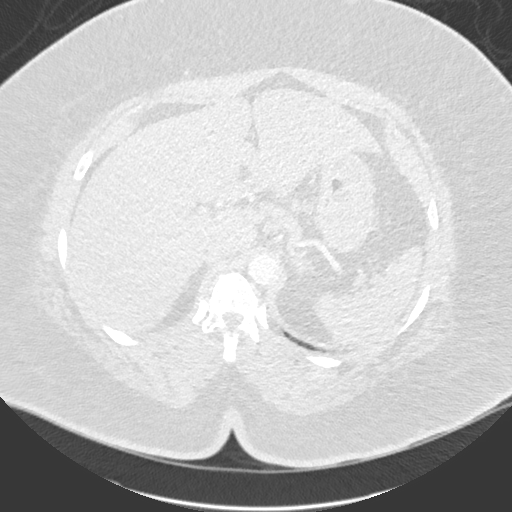
[im 31/257  mediastinal]
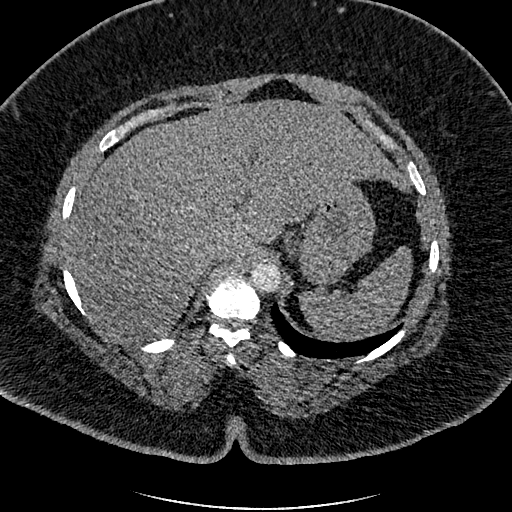
[im 46/257  lung]
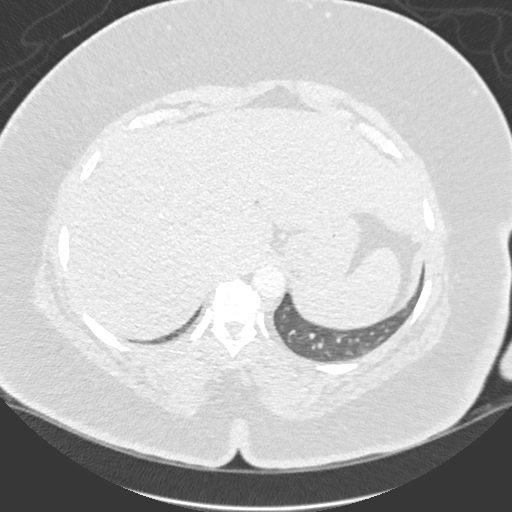
[im 61/257  mediastinal]
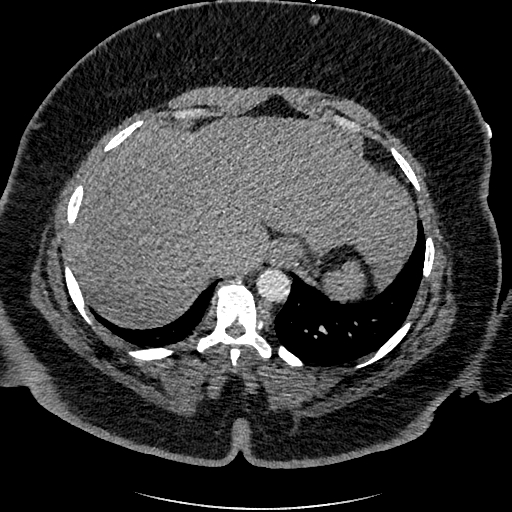
[im 76/257  lung]
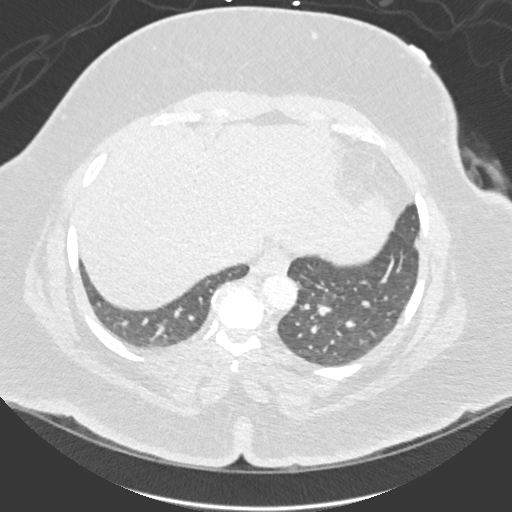
[im 91/257  mediastinal]
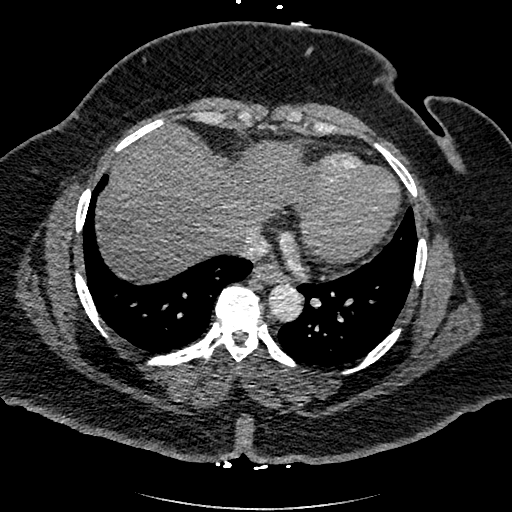
[im 106/257  lung]
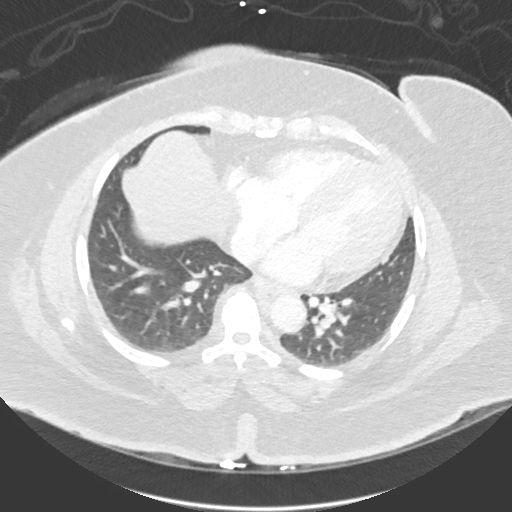
[im 121/257  mediastinal]
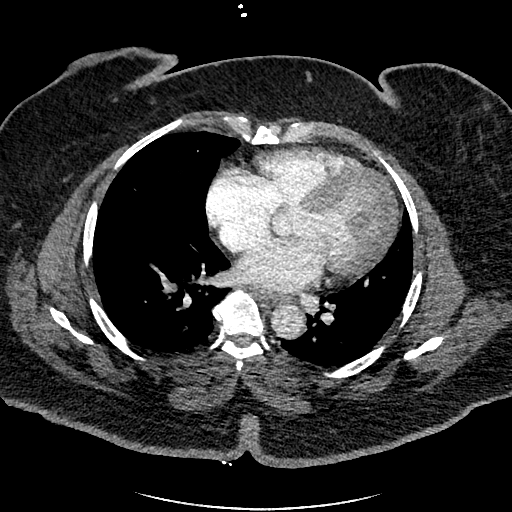
[im 136/257  lung]
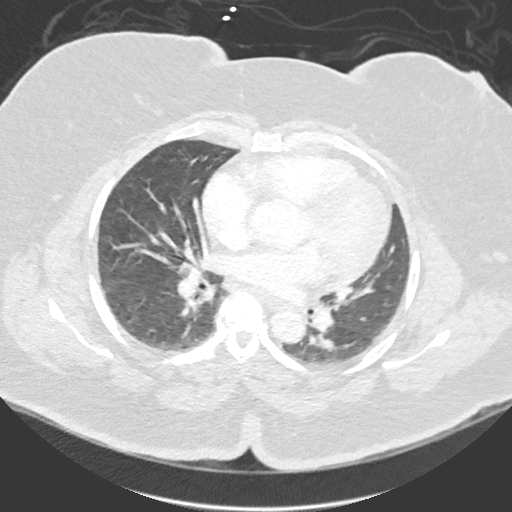
[im 151/257  mediastinal]
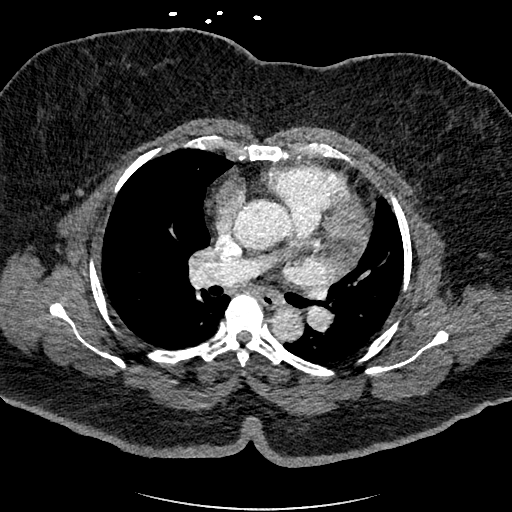
[im 166/257  lung]
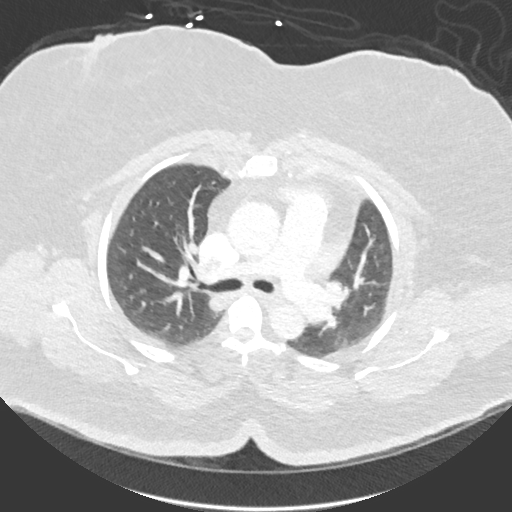
[im 181/257  mediastinal]
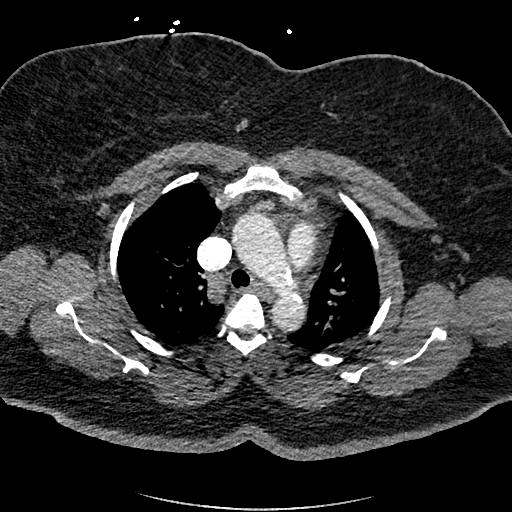
[im 196/257  lung]
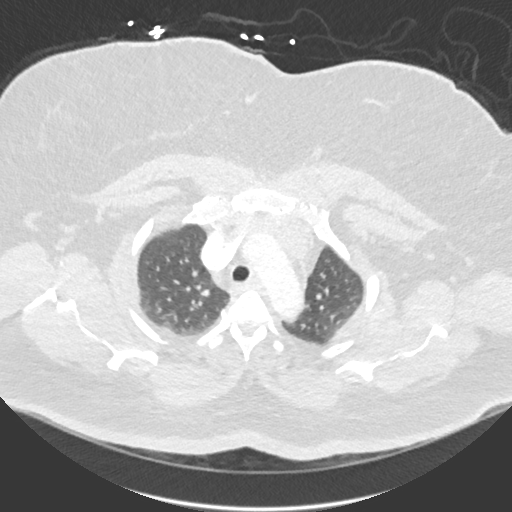
[im 211/257  mediastinal]
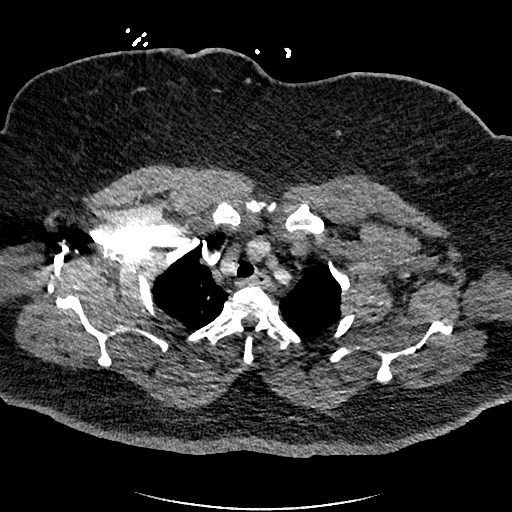
[im 226/257  lung]
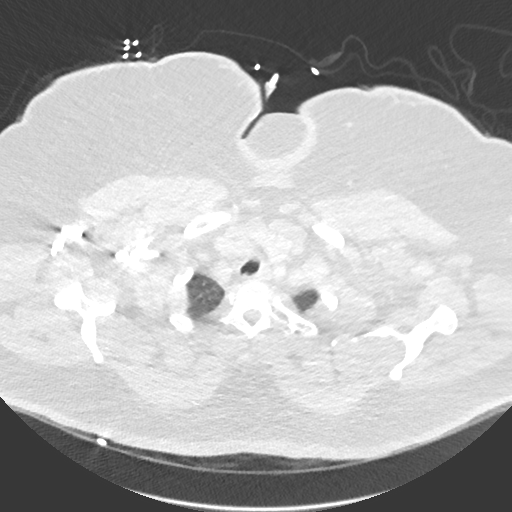
[im 241/257  mediastinal]
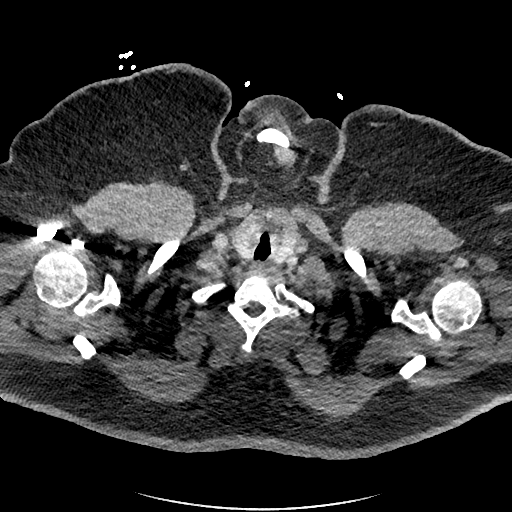

[Series 7: pe 2mm cor · coronal · 0.59mm/px · 1 of 131 slices shown]
[im 66/131  mediastinal]
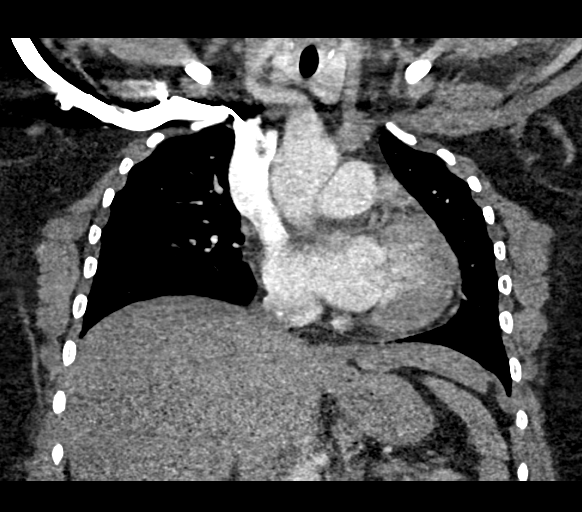

[17 of 36 positions shown; findings below may reference images not displayed]

FINDINGS: Sheizad the lungs are clear. There is no pleural effusion or
pneumothorax. The central airways are patent.

Minimal atherosclerotic calcification of the thoracic aorta. No
dissection or aneurysm. The origins of the great vessels of the
aortic arch appear patent. Evaluation of the pulmonary arteries is
limited due to streak artifact and suboptimal opacification and
timing of the contrast. No definite central pulmonary artery embolus
identified. Top-normal cardiac size. No pericardial effusion. There
is no hilar or mediastinal adenopathy. The esophagus is grossly
unremarkable. No thyroid nodules identified.

There is no axillary adenopathy. The chest wall soft tissues appear
unremarkable. There is mild degenerative changes of the spine. No
acute osseous pathology.

The visualized upper abdomen appears unremarkable.

Review of the MIP images confirms the above findings.
IMPRESSION: No CT evidence of central pulmonary artery embolus. V/Q scan may
provide additional evaluation if there is high clinical concern for
PE.

## 2018-01-31 DIAGNOSIS — I10 Essential (primary) hypertension: Secondary | ICD-10-CM

## 2018-02-14 ENCOUNTER — Telehealth: Payer: Self-pay | Admitting: Pulmonary Disease

## 2018-02-14 NOTE — Telephone Encounter (Signed)
I did not order the cxr or address this issue but the record shows she has a pos skin test for TB in 2017 and not clear who addressed this at the time ? Health dept?   We can give her the last  cxr report showing no active TB  or refer not to health dept re the Pos TB test if she doesn't know who did that eval  But I'm  not willing to write any letters re status of her TB at this point and this issue would be best addressed by ID or Health dept as no evidence of active pulmonary tb as of last cxr  but could have latent dz

## 2018-02-14 NOTE — Telephone Encounter (Addendum)
ATC pt, no answer. Left message for pt to call back.  We did not order the last chest xray?  In the meantime, she is a VS pt for OSA, MW would it be ok to write this letter?   Patient Instructions by Nyoka CowdenWert, Michael B, MD at 07/08/2017 2:00 PM  Author: Nyoka CowdenWert, Michael B, MD Author Type: Physician Filed: 07/08/2017 2:14 PM  Note Status: Addendum Cosign: Cosign Not Required Encounter Date: 07/08/2017  Editor: Nyoka CowdenWert, Michael B, MD (Physician)  Prior Versions: 1. Nyoka CowdenWert, Michael B, MD (Physician) at 07/08/2017 2:14 PM - Addendum   2. Nyoka CowdenWert, Michael B, MD (Physician) at 07/08/2017 2:08 PM - Addendum   3. Nyoka CowdenWert, Michael B, MD (Physician) at 07/08/2017 2:05 PM - Signed    Virgel BouquetBreo  200 each am - smooth and deep   Only use your albuterol as a rescue medication to be used if you can't catch your breath by resting or doing a relaxed purse lip breathing pattern.  - The less you use it, the better it will work when you need it. - Ok to use up to 2 puffs  every 4 hours if you must but call for immediate appointment if use goes up over your usual need - Don't leave home without it !!  (think of it like the spare tire for your car)   Call the number on your cpap machine for help getting the equipment fixed and if can't figure this out call Almyra FreeLibby [phone] for assistance     Sleep medicine next available

## 2018-02-15 NOTE — Telephone Encounter (Signed)
Thank you Dr Leverne HumblesWert Called Sean at [phone] - advised that MW okayed the letter but that we would recommend pt follow up with her local health dept or ID.  Gregary SignsSean okay with this and thanked us for our assistance.  Letter can be faxed to [phone] (verified twice), attn Gregary SignsSean He is okay with receiving letter in the morning  Letter printed and placed on MW's desk for signature in the morning Will leave message open to ensure follow up

## 2018-02-15 NOTE — Telephone Encounter (Signed)
lmtcb x1 for pt. 

## 2018-02-15 NOTE — Telephone Encounter (Signed)
Shawn with HR returned call  Spoke with Shawn who reported that patient Cynitha has applied for a position with his office and he would like to extend an offer to her.  For the interview, pt brought in the letter that was done for her on 12.20.17 (in epic).  Ines BloomerShawn is requesting an updated copy of this letter with reference to last cxr on behalf of patient so that she may be hired.  Did advise Shawn that patient has not been seen within the past 6 months and MW is reluctant to write letter.  Unfortunately, pt and employer are in [place].  If letter cannot be done, pt will miss opportunity.  If this can be done, this will need to be completed by 5pm today - no extensions can be given.  Sorry Dr Sherene SiresWert, you saw the patient twice.  Would it be possible to address this for the patient?  Perhaps we could mention previous PPD results and treatment, include last cxr results and recommend that patient follow up in [place] with her local Health Dept or ID clinic.  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 A chest xray was performed today in our office, which was negative. 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  If you may have any further questions or concerns, feel free to contact us.

## 2018-02-15 NOTE — Telephone Encounter (Signed)
It's fine to write the letter but it should say she reported being treated for TB in 2009  (as I can't verify it)

## 2018-02-16 NOTE — Telephone Encounter (Signed)
I have faxed the letter to the number provided attn sean  Left detailed msg on pt's VM letting her know that this was done

## 2018-03-09 ENCOUNTER — Telehealth: Payer: Self-pay | Admitting: Family Medicine

## 2018-03-09 NOTE — Telephone Encounter (Signed)
[facility] called requesting for a few of patients medications to be refilled until patient can find a new pcp in [place] where she recently moved to, if possible. [facility] in [place], [place] [phone]

## 2018-03-14 ENCOUNTER — Other Ambulatory Visit: Payer: Self-pay

## 2018-03-14 DIAGNOSIS — J4520 Mild intermittent asthma, uncomplicated: Secondary | ICD-10-CM

## 2018-03-14 DIAGNOSIS — I10 Essential (primary) hypertension: Secondary | ICD-10-CM

## 2018-03-14 MED ORDER — FLUTICASONE FUROATE-VILANTEROL 200-25 MCG/INH IN AEPB: 1.0000 | INHALATION_SPRAY | Freq: Every day | RESPIRATORY_TRACT | 3 refills | Status: DC

## 2018-03-14 MED ORDER — SPIRONOLACTONE 25 MG PO TABS
25.0000 mg | ORAL_TABLET | Freq: Every day | ORAL | 3 refills | Status: AC
Start: 2018-03-14 — End: ?

## 2018-03-14 MED ORDER — METOPROLOL SUCCINATE ER 50 MG PO TB24: 50.0000 mg | ORAL_TABLET | Freq: Every day | ORAL | 3 refills | Status: AC

## 2018-03-14 MED ORDER — ATORVASTATIN CALCIUM 20 MG PO TABS: 20.0000 mg | ORAL_TABLET | Freq: Every day | ORAL | 3 refills | Status: AC

## 2018-03-14 NOTE — Telephone Encounter (Signed)
Done

## 2018-03-15 ENCOUNTER — Other Ambulatory Visit: Payer: Self-pay | Admitting: Family Medicine

## 2018-03-15 MED ORDER — FLUTICASONE-SALMETEROL 250-50 MCG/DOSE IN AEPB: 1.0000 | INHALATION_SPRAY | Freq: Two times a day (BID) | RESPIRATORY_TRACT | 3 refills | Status: AC

## 2018-03-27 ENCOUNTER — Telehealth: Payer: Self-pay | Admitting: Pulmonary Disease

## 2018-03-29 NOTE — Telephone Encounter (Signed)
Error

## 2018-06-01 IMAGING — CT CT PARANASAL SINUSES LIMITED
1 of 2 series · 7 of 10 positions shown, 9 images · non-contrast
Comparison: None.

CLINICAL DATA: Nasal congestion with green discharge tinged with
blood for 2-3 days. Fever. Maxillary sinus pressure and face
puffiness. Eval for sinusitis. Bb outer canthus rt eye.

EXAM:
CT PARANASAL SINUS LIMITED WITHOUT CONTRAST
TECHNIQUE: Non-contiguous multidetector CT images of the paranasal sinuses were
obtained in a single plane without contrast.

[Series 4: limited sinus st · axial · 0.26mm/px · z∈[+51,+111]mm · 7 of 9 slices shown, 9 images]
[im 2/9  brain]
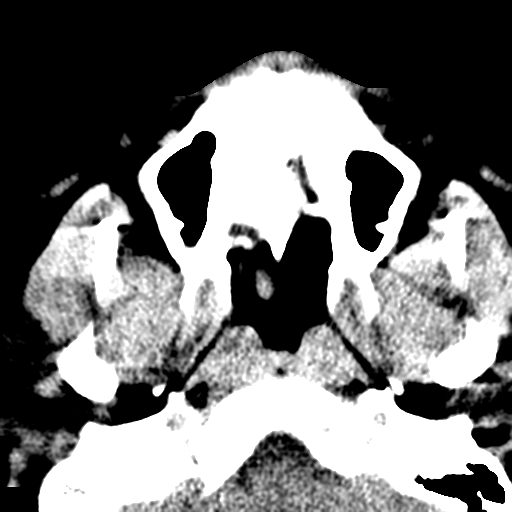
[im 2/9  bone]
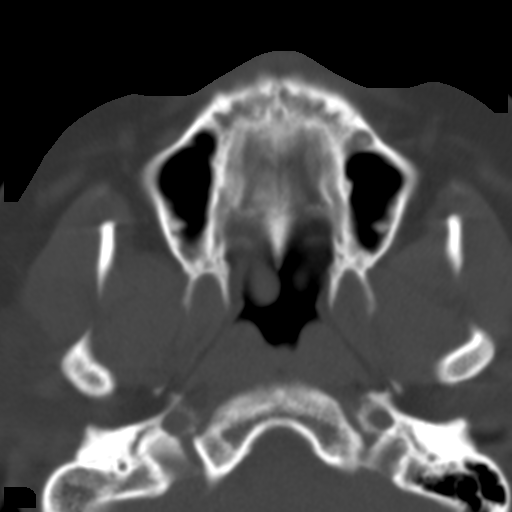
[im 3/9  bone]
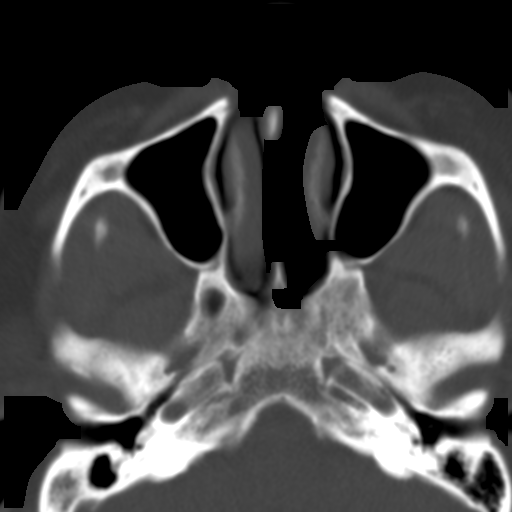
[im 4/9  bone]
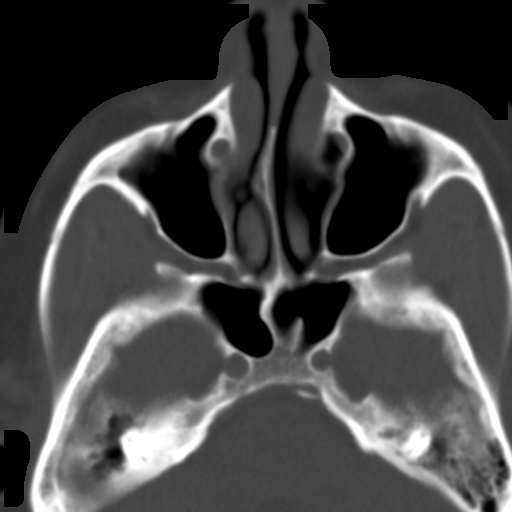
[im 5/9  bone]
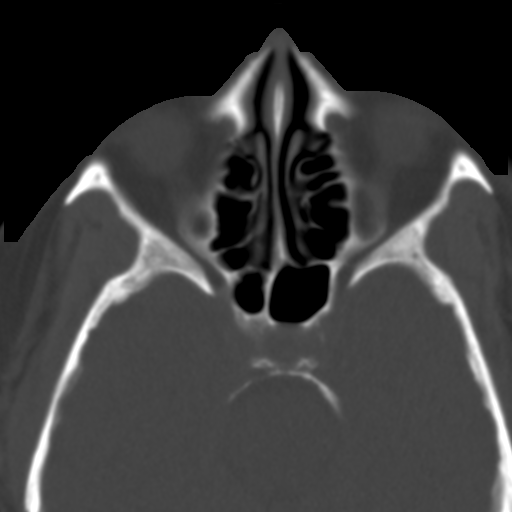
[im 6/9  brain]
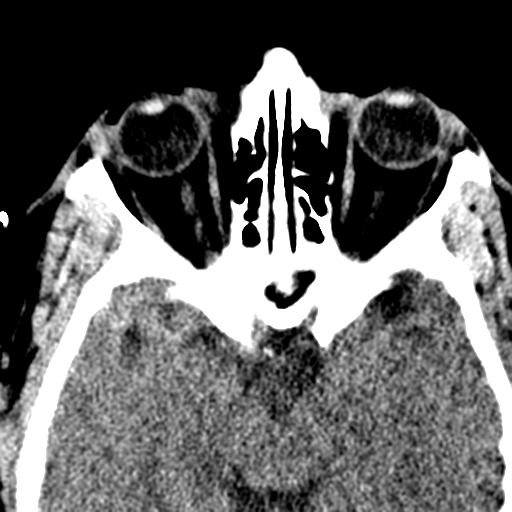
[im 6/9  bone]
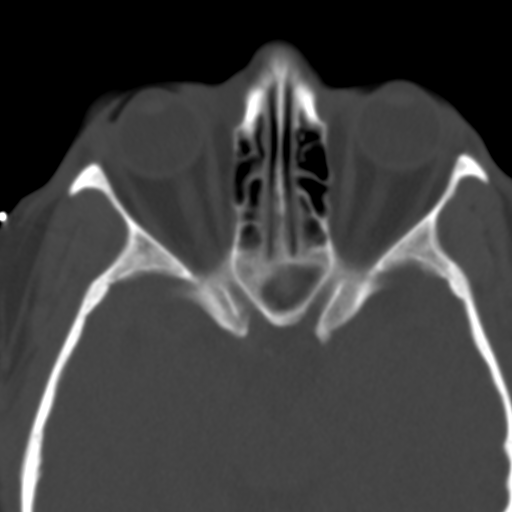
[im 7/9  bone]
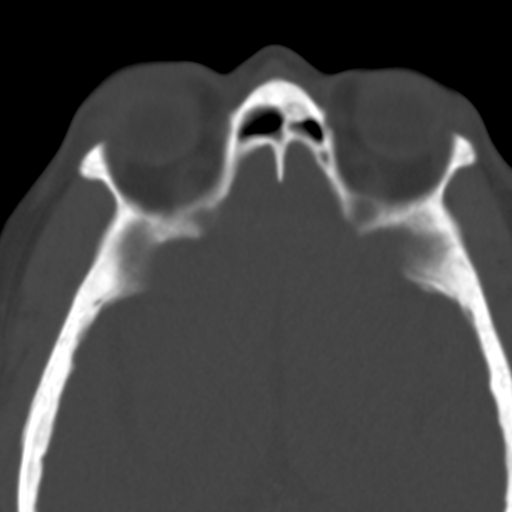
[im 8/9  bone]
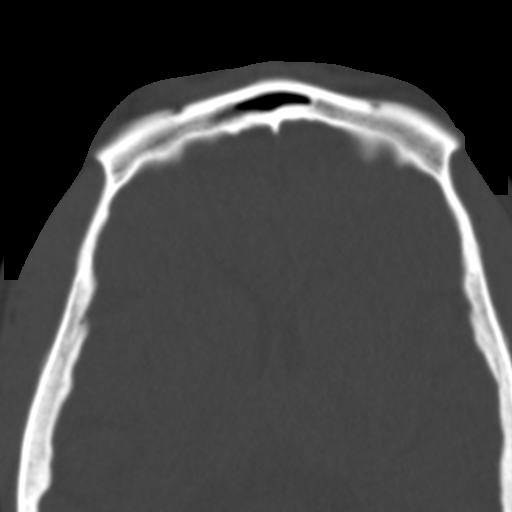

[7 of 10 positions shown; findings below may reference images not displayed]

FINDINGS: Selected cuts demonstrate normally aerated paranasal sinuses. No
significant mucoperiosteal thickening of the sinus cavities or
air-fluid levels are identified. There is mild mucosal thickening of
the terminates, right greater than left. Limited visualization of
the brain is unremarkable.
IMPRESSION: Mild mucosal thickening of the terminates, right greater than left.

No evidence for acute sinusitis .
